# Patient Record
Sex: Female | Born: 1985 | Race: White | Hispanic: No | Marital: Single | Smoking: Current every day smoker
Health system: Southern US, Community
[De-identification: ages and names within clinical notes are randomized; demographics above are authoritative.]

## PROBLEM LIST (undated history)

## (undated) ENCOUNTER — Ambulatory Visit (HOSPITAL_COMMUNITY): Admission: EM | Discharge status: Absent without leave | Payer: Self-pay

---

## 2009-09-11 ENCOUNTER — Emergency Department (HOSPITAL_COMMUNITY): Admission: EM | Admit: 2009-09-11 | Discharge: 2009-09-11 | Payer: Self-pay | Admitting: Emergency Medicine

## 2009-09-12 ENCOUNTER — Inpatient Hospital Stay (HOSPITAL_COMMUNITY): Admission: EM | Admit: 2009-09-12 | Discharge: 2009-09-17 | Payer: Self-pay | Admitting: Psychiatry

## 2009-09-13 ENCOUNTER — Ambulatory Visit: Payer: Self-pay | Admitting: Psychiatry

## 2011-02-08 LAB — RAPID URINE DRUG SCREEN, HOSP PERFORMED
Amphetamines: NOT DETECTED
Benzodiazepines: POSITIVE — AB
Cocaine: NOT DETECTED
Opiates: NOT DETECTED
Tetrahydrocannabinol: NOT DETECTED

## 2011-02-08 LAB — GLUCOSE, CAPILLARY: Glucose-Capillary: 92 mg/dL (ref 70–99)

## 2011-02-08 LAB — COMPREHENSIVE METABOLIC PANEL
ALT: 12 U/L (ref 0–35)
Calcium: 9.6 mg/dL (ref 8.4–10.5)
GFR calc Af Amer: >60 mL/min (ref >60)
Glucose, Bld: 84 mg/dL (ref 70–99)
Sodium: 138 mEq/L (ref 135–145)
Total Protein: 7.2 g/dL (ref 6.0–8.3)

## 2011-02-08 LAB — POCT I-STAT, CHEM 8
BUN: 8 mg/dL (ref 6–23)
Chloride: 101 mEq/L (ref 96–112)
Creatinine, Ser: 0.8 mg/dL (ref 0.4–1.2)
Potassium: 3.4 mEq/L — ABNORMAL LOW (ref 3.5–5.1)
Sodium: 139 mEq/L (ref 135–145)

## 2011-02-08 LAB — CBC
Hemoglobin: 13.7 g/dL (ref 12.0–15.0)
MCHC: 34.5 g/dL (ref 30.0–36.0)
RDW: 13.7 % (ref 11.5–15.5)

## 2011-02-08 LAB — ACETAMINOPHEN LEVEL: Acetaminophen (Tylenol), Serum: <10 ug/mL — ABNORMAL LOW (ref 10–30)

## 2011-02-08 LAB — DIFFERENTIAL
Eosinophils Absolute: 0.2 10*3/uL (ref 0.0–0.7)
Lymphocytes Relative: 36 % (ref 12–46)
Lymphs Abs: 3.2 10*3/uL (ref 0.7–4.0)
Monocytes Relative: 7 % (ref 3–12)
Neutrophils Relative %: 55 % (ref 43–77)

## 2017-05-03 NOTE — Congregational Nurse Program (Signed)
Congregational Nurse Program Note  Date of Encounter: 05/02/2017  Past Medical History: No past medical history on file.  Encounter Details:     CNP Questionnaire - 05/02/17 1548      Patient Demographics   Is this a new or existing patient? New   Patient is considered a/an Not Applicable   Race Caucasian/White     Patient Assistance   Location of Patient Assistance [place]   Patient's financial/insurance status Low Income;Self-Pay (Uninsured)   Uninsured Patient ([employer]s) Yes   Interventions Assisted patient in making appt.   Patient referred to apply for the following financial assistance [employer]   Food insecurities addressed Not Applicable   Transportation assistance No   Assistance securing medications No   Educational health offerings Behavioral health;Navigating the healthcare system     Encounter Details   Primary purpose of visit Chronic Illness/Condition Visit;Safety;Navigating the Healthcare System   Was an Emergency Department visit averted? Not Applicable   Does patient have a medical provider? No   Patient referred to Clinic   Was a mental health screening completed? (GAINS tool) No   Does patient have dental issues? No   Does patient have vision issues? No   Does your patient have an abnormal blood pressure today? No   Since previous encounter, have you referred patient for abnormal blood pressure that resulted in a new diagnosis or medication change? No   Does your patient have an abnormal blood glucose today? No   Since previous encounter, have you referred patient for abnormal blood glucose that resulted in a new diagnosis or medication change? No   Was there a life-saving intervention made? No     Requested assistance with obtaining a HCP and substance abuse treatment.  Referred to [facility].

## 2017-06-23 ENCOUNTER — Encounter: Payer: Self-pay | Admitting: Emergency Medicine

## 2017-06-23 ENCOUNTER — Emergency Department (HOSPITAL_COMMUNITY)
Admission: EM | Admit: 2017-06-23 | Discharge: 2017-06-23 | Disposition: A | Discharge status: Home / Self Care | Payer: Self-pay | Attending: Emergency Medicine | Admitting: Emergency Medicine

## 2017-06-23 DIAGNOSIS — R6883 Chills (without fever): Secondary | ICD-10-CM | POA: Insufficient documentation

## 2017-06-23 DIAGNOSIS — Z79899 Other long term (current) drug therapy: Secondary | ICD-10-CM | POA: Insufficient documentation

## 2017-06-23 DIAGNOSIS — F1193 Opioid use, unspecified with withdrawal: Secondary | ICD-10-CM

## 2017-06-23 DIAGNOSIS — R197 Diarrhea, unspecified: Secondary | ICD-10-CM | POA: Insufficient documentation

## 2017-06-23 DIAGNOSIS — F1123 Opioid dependence with withdrawal: Secondary | ICD-10-CM | POA: Insufficient documentation

## 2017-06-23 LAB — RAPID URINE DRUG SCREEN, HOSP PERFORMED
Amphetamines: POSITIVE — AB
Barbiturates: NOT DETECTED
Benzodiazepines: NOT DETECTED
Cocaine: NOT DETECTED
Opiates: NOT DETECTED
Tetrahydrocannabinol: NOT DETECTED

## 2017-06-23 LAB — CBC WITH DIFFERENTIAL/PLATELET
Basophils Absolute: 0 K/uL (ref 0.0–0.1)
Basophils Relative: 0 %
Eosinophils Absolute: 0.1 K/uL (ref 0.0–0.7)
Eosinophils Relative: 1 %
HCT: 38.6 % (ref 36.0–46.0)
Hemoglobin: 12.6 g/dL (ref 12.0–15.0)
Lymphocytes Relative: 15 %
Lymphs Abs: 1.3 K/uL (ref 0.7–4.0)
MCH: 28.7 pg (ref 26.0–34.0)
MCHC: 32.6 g/dL (ref 30.0–36.0)
MCV: 87.9 fL (ref 78.0–100.0)
Monocytes Absolute: 0.5 K/uL (ref 0.1–1.0)
Monocytes Relative: 6 %
Neutro Abs: 6.3 K/uL (ref 1.7–7.7)
Neutrophils Relative %: 78 %
Platelets: 303 K/uL (ref 150–400)
RBC: 4.39 MIL/uL (ref 3.87–5.11)
RDW: 12.2 % (ref 11.5–15.5)
WBC: 8.1 K/uL (ref 4.0–10.5)

## 2017-06-23 LAB — COMPREHENSIVE METABOLIC PANEL
ALT: 17 U/L (ref 14–54)
ANION GAP: 7 (ref 5–15)
AST: 23 U/L (ref 15–41)
Albumin: 3.4 g/dL — ABNORMAL LOW (ref 3.5–5.0)
Alkaline Phosphatase: 44 U/L (ref 38–126)
BUN: 11 mg/dL (ref 6–20)
CHLORIDE: 102 mmol/L (ref 101–111)
CO2: 26 mmol/L (ref 22–32)
Calcium: 9 mg/dL (ref 8.9–10.3)
Creatinine, Ser: 0.76 mg/dL (ref 0.44–1.00)
Glucose, Bld: 145 mg/dL — ABNORMAL HIGH (ref 65–99)
POTASSIUM: 4.5 mmol/L (ref 3.5–5.1)
Sodium: 135 mmol/L (ref 135–145)
TOTAL PROTEIN: 6.2 g/dL — AB (ref 6.5–8.1)
Total Bilirubin: 0.6 mg/dL (ref 0.3–1.2)

## 2017-06-23 LAB — ETHANOL: Alcohol, Ethyl (B): <5 mg/dL (ref <5)

## 2017-06-23 LAB — PREGNANCY, URINE: Preg Test, Ur: NEGATIVE

## 2017-06-23 MED ORDER — DICYCLOMINE HCL 20 MG PO TABS: 20.0000 mg | ORAL_TABLET | Freq: Four times a day (QID) | ORAL | 0 refills | Status: DC | PRN

## 2017-06-23 MED ORDER — NAPROXEN 500 MG PO TABS: 500.0000 mg | ORAL_TABLET | Freq: Two times a day (BID) | ORAL | 0 refills | Status: DC | PRN

## 2017-06-23 MED ORDER — ONDANSETRON 4 MG PO TBDP: 4.0000 mg | ORAL_TABLET | Freq: Three times a day (TID) | ORAL | 0 refills | Status: DC | PRN

## 2017-06-23 MED ORDER — HYDROXYZINE HCL 25 MG PO TABS: 25.0000 mg | ORAL_TABLET | Freq: Four times a day (QID) | ORAL | 0 refills | Status: DC | PRN

## 2017-06-23 MED ORDER — LOPERAMIDE HCL 2 MG PO CAPS: 2.0000 mg | ORAL_CAPSULE | Freq: Four times a day (QID) | ORAL | 0 refills | Status: DC | PRN

## 2017-06-23 MED ORDER — CLONIDINE HCL 0.1 MG PO TABS: ORAL_TABLET | ORAL | 0 refills | Status: DC

## 2017-06-23 MED ORDER — METHOCARBAMOL 500 MG PO TABS: 500.0000 mg | ORAL_TABLET | Freq: Three times a day (TID) | ORAL | 0 refills | Status: DC | PRN

## 2017-06-23 NOTE — ED Notes (Signed)
Pt aware that urine sample is needed.  

## 2017-06-23 NOTE — ED Provider Notes (Signed)
[facility]-EMERGENCY DEPT Provider Note   CSN: [id number] Arrival date & time: 06/23/17  0086     History   Chief Complaint Chief Complaint  Patient presents with  . Drug Problem    HPI Betty Carter is a 31 y.o. female.  HPI Patient presents with substance abuse. Has been using heavily for the last month, but a gram a day. Had been clean for around 5 years before this. Comes in for treatment. States that she hopes to follow-up with a DS but has not contacted them yet. She injects on her arms. Last use heavily last night and had a smaller doses morning disorder help with withdrawal. Has had some diarrhea. Some nausea. No chest pain or trouble breathing. No fevers. Patient says she hope she is not pregnant. Denies other substance abuse. Denies suicidal or homicidal thoughts. States she uses clean needles and has not had HIV or hepatitis in the past. No past medical history on file.  There are no active problems to display for this patient.   No past surgical history on file.  OB History    No data available       Home Medications    Prior to Admission medications   Medication Sig Start Date End Date Taking? Authorizing Provider  Multiple Vitamin (MULTIVITAMIN WITH MINERALS) TABS tablet Take 1 tablet by mouth daily.   Yes [provider]  naproxen sodium (ANAPROX) 220 MG tablet Take 220 mg by mouth as needed (Headache/Pain).   Yes [provider]  nicotine (NICODERM CQ - DOSED IN MG/24 HOURS) 14 mg/24hr patch Place 14 mg onto the skin daily.   Yes [provider]  Norgestim-Eth Estrad Triphasic (ORTHO TRI-CYCLEN, 28, PO) Take 1 tablet by mouth daily.   Yes [provider]  cloNIDine (CATAPRES) 0.1 MG tablet 1 tab 4 times a day for 6 doses, then twice a day for 4 doses, then in the morning for 2 doses 06/23/17   Benjiman Core, MD  dicyclomine (BENTYL) 20 MG tablet Take 1 tablet (20 mg total) by mouth 4 (four) times daily as needed for spasms.  06/23/17   Benjiman Core, MD  hydrOXYzine (ATARAX/VISTARIL) 25 MG tablet Take 1 tablet (25 mg total) by mouth every 6 (six) hours as needed for anxiety. 06/23/17   Benjiman Core, MD  loperamide (IMODIUM) 2 MG capsule Take 1 capsule (2 mg total) by mouth 4 (four) times daily as needed for diarrhea or loose stools. 06/23/17   Benjiman Core, MD  methocarbamol (ROBAXIN) 500 MG tablet Take 1 tablet (500 mg total) by mouth every 8 (eight) hours as needed for muscle spasms. 06/23/17   Benjiman Core, MD  naproxen (NAPROSYN) 500 MG tablet Take 1 tablet (500 mg total) by mouth 2 (two) times daily as needed. 06/23/17   Benjiman Core, MD  ondansetron (ZOFRAN-ODT) 4 MG disintegrating tablet Take 1 tablet (4 mg total) by mouth every 8 (eight) hours as needed for nausea or vomiting. 06/23/17   Benjiman Core, MD    Family History No family history on file.  Social History Social History  Substance Use Topics  . Smoking status: Not on file  . Smokeless tobacco: Not on file  . Alcohol use Not on file     Allergies   Patient has no known allergies.   Review of Systems Review of Systems  Constitutional: Positive for appetite change and chills. Negative for fever.  HENT: Negative for congestion.   Respiratory: Negative for shortness of breath.  Cardiovascular: Negative for chest pain.  Gastrointestinal: Positive for diarrhea and nausea. Negative for abdominal pain and constipation.  Genitourinary: Negative for flank pain.  Musculoskeletal: Negative for back pain.  Skin: Positive for wound.  Neurological: Negative for tremors and weakness.  Psychiatric/Behavioral: Negative for confusion.     Physical Exam Updated Vital Signs LMP 06/16/2017   Physical Exam  Constitutional: She appears well-developed.  HENT:  Head: Atraumatic.  Neck: Neck supple.  Cardiovascular: Normal rate.   Pulmonary/Chest: Effort normal. She exhibits no tenderness.  Abdominal: Soft.    Musculoskeletal: She exhibits no edema.  Neurological: She is alert.  Skin: Skin is warm. Capillary refill takes less than 2 seconds.  Injection sites bilateral antecubital areas without clear abscess or cellulitis.  Psychiatric: She has a normal mood and affect.     ED Treatments / Results  Labs (all labs ordered are listed, but only abnormal results are displayed) Labs Reviewed  RAPID URINE DRUG SCREEN, HOSP PERFORMED - Abnormal; Notable for the following:       Result Value   Amphetamines POSITIVE (*)    All other components within normal limits  COMPREHENSIVE METABOLIC PANEL - Abnormal; Notable for the following:    Glucose, Bld 145 (*)    Total Protein 6.2 (*)    Albumin 3.4 (*)    All other components within normal limits  CBC WITH DIFFERENTIAL/PLATELET  ETHANOL  PREGNANCY, URINE  HIV ANTIBODY (ROUTINE TESTING)    EKG  EKG Interpretation None       Radiology No results found.  Procedures Procedures (including critical care time)  Medications Ordered in ED Medications - No data to display   Initial Impression / Assessment and Plan / ED Course  I have reviewed the triage vital signs and the nursing notes.  Pertinent labs & imaging results that were available during my care of the patient were reviewed by me and considered in my medical decision making (see chart for details).   patient requesting heroin withdrawal treatment. Labs reassuring. No clear abscesses. Does not appear to need acute inpatient treatment. Sent home with clonidine protocol. States she has follow-up. Will discharge.  Final Clinical Impressions(s) / ED Diagnoses   Final diagnoses:  Opiate withdrawal (HCC)    New Prescriptions Discharge Medication List as of 06/23/2017 11:42 AM    START taking these medications   Details  cloNIDine (CATAPRES) 0.1 MG tablet 1 tab 4 times a day for 6 doses, then twice a day for 4 doses, then in the morning for 2 doses, Print    dicyclomine (BENTYL)  20 MG tablet Take 1 tablet (20 mg total) by mouth 4 (four) times daily as needed for spasms., Starting Sat 06/23/2017, Print    hydrOXYzine (ATARAX/VISTARIL) 25 MG tablet Take 1 tablet (25 mg total) by mouth every 6 (six) hours as needed for anxiety., Starting Sat 06/23/2017, Print    loperamide (IMODIUM) 2 MG capsule Take 1 capsule (2 mg total) by mouth 4 (four) times daily as needed for diarrhea or loose stools., Starting Sat 06/23/2017, Print    methocarbamol (ROBAXIN) 500 MG tablet Take 1 tablet (500 mg total) by mouth every 8 (eight) hours as needed for muscle spasms., Starting Sat 06/23/2017, Print    naproxen (NAPROSYN) 500 MG tablet Take 1 tablet (500 mg total) by mouth 2 (two) times daily as needed., Starting Sat 06/23/2017, Print    ondansetron (ZOFRAN-ODT) 4 MG disintegrating tablet Take 1 tablet (4 mg total) by mouth every 8 (eight) hours  as needed for nausea or vomiting., Starting Sat 06/23/2017, Print         Benjiman Core, MD 06/23/17 1155

## 2017-06-23 NOTE — ED Notes (Signed)
Patient here for heroine detox. Denies SI/HI.

## 2017-06-23 NOTE — ED Triage Notes (Signed)
Pt here for relapse of heroine has been using for the last month after being clean for 5 years. Pt denies thoughts of SI/HI.

## 2017-06-24 LAB — HIV ANTIBODY (ROUTINE TESTING W REFLEX): HIV Screen 4th Generation wRfx: NONREACTIVE

## 2018-03-12 ENCOUNTER — Emergency Department (HOSPITAL_COMMUNITY)
Admission: EM | Admit: 2018-03-12 | Discharge: 2018-03-12 | Disposition: A | Discharge status: Home / Self Care | Payer: Self-pay | Attending: Emergency Medicine | Admitting: Emergency Medicine

## 2018-03-12 ENCOUNTER — Encounter (HOSPITAL_COMMUNITY): Payer: Self-pay

## 2018-03-12 ENCOUNTER — Other Ambulatory Visit: Payer: Self-pay

## 2018-03-12 DIAGNOSIS — D690 Allergic purpura: Secondary | ICD-10-CM | POA: Insufficient documentation

## 2018-03-12 DIAGNOSIS — Z79899 Other long term (current) drug therapy: Secondary | ICD-10-CM | POA: Insufficient documentation

## 2018-03-12 DIAGNOSIS — F17200 Nicotine dependence, unspecified, uncomplicated: Secondary | ICD-10-CM | POA: Insufficient documentation

## 2018-03-12 LAB — CBC WITH DIFFERENTIAL/PLATELET
BASOS ABS: 0 10*3/uL (ref 0.0–0.1)
Basophils Relative: 0 %
EOS PCT: 1 %
Eosinophils Absolute: 0 10*3/uL (ref 0.0–0.7)
HCT: 33 % — ABNORMAL LOW (ref 36.0–46.0)
Hemoglobin: 10.9 g/dL — ABNORMAL LOW (ref 12.0–15.0)
Lymphocytes Relative: 17 %
Lymphs Abs: 1.4 10*3/uL (ref 0.7–4.0)
MCH: 27.2 pg (ref 26.0–34.0)
MCHC: 33 g/dL (ref 30.0–36.0)
MCV: 82.3 fL (ref 78.0–100.0)
MONO ABS: 0.8 10*3/uL (ref 0.1–1.0)
Monocytes Relative: 10 %
Neutro Abs: 5.8 10*3/uL (ref 1.7–7.7)
Neutrophils Relative %: 72 %
PLATELETS: 225 10*3/uL (ref 150–400)
RBC: 4.01 MIL/uL (ref 3.87–5.11)
RDW: 13.4 % (ref 11.5–15.5)
WBC: 8 10*3/uL (ref 4.0–10.5)

## 2018-03-12 LAB — COMPREHENSIVE METABOLIC PANEL
ALT: 18 U/L (ref 14–54)
AST: 19 U/L (ref 15–41)
Albumin: 3.4 g/dL — ABNORMAL LOW (ref 3.5–5.0)
Alkaline Phosphatase: 76 U/L (ref 38–126)
Anion gap: 8 (ref 5–15)
BUN: 12 mg/dL (ref 6–20)
CHLORIDE: 101 mmol/L (ref 101–111)
CO2: 26 mmol/L (ref 22–32)
Calcium: 8.7 mg/dL — ABNORMAL LOW (ref 8.9–10.3)
Creatinine, Ser: 0.73 mg/dL (ref 0.44–1.00)
GFR calc Af Amer: >60 mL/min (ref >60)
Glucose, Bld: 90 mg/dL (ref 65–99)
POTASSIUM: 4.2 mmol/L (ref 3.5–5.1)
Sodium: 135 mmol/L (ref 135–145)
Total Bilirubin: 0.6 mg/dL (ref 0.3–1.2)
Total Protein: 7 g/dL (ref 6.5–8.1)

## 2018-03-12 LAB — PROTIME-INR
INR: 1.07
PROTHROMBIN TIME: 13.8 s (ref 11.4–15.2)

## 2018-03-12 LAB — I-STAT CG4 LACTIC ACID, ED: LACTIC ACID, VENOUS: 0.34 mmol/L — AB (ref 0.5–1.9)

## 2018-03-12 LAB — URINALYSIS, ROUTINE W REFLEX MICROSCOPIC
Bilirubin Urine: NEGATIVE
Glucose, UA: NEGATIVE mg/dL
KETONES UR: NEGATIVE mg/dL
Leukocytes, UA: NEGATIVE
Nitrite: NEGATIVE
PH: 5 (ref 5.0–8.0)
PROTEIN: NEGATIVE mg/dL
Specific Gravity, Urine: 1.023 (ref 1.005–1.030)

## 2018-03-12 LAB — RAPID URINE DRUG SCREEN, HOSP PERFORMED
AMPHETAMINES: POSITIVE — AB
BENZODIAZEPINES: POSITIVE — AB
Barbiturates: NOT DETECTED
COCAINE: NOT DETECTED
Opiates: NOT DETECTED
Tetrahydrocannabinol: NOT DETECTED

## 2018-03-12 MED ORDER — PREDNISONE 20 MG PO TABS: 40.0000 mg | ORAL_TABLET | Freq: Every day | ORAL | 0 refills | Status: DC

## 2018-03-12 NOTE — ED Provider Notes (Signed)
Patient placed in Quick Look pathway, seen and evaluated   Chief Complaint: Rash  HPI: Patient with history of heroin abuse -- presents with 1 day of rapidly progressing rash on bilateral lower extremities.  Areas started as a swollen red area on her right lateral ankle.  Overnight patient developed many more areas including her bilateral buttocks and several areas on her legs have turned black centrally.  She complains of significant pain and inability to sit.  She has felt flushed but denies fevers.  No nausea or vomiting.  No treatments prior to arrival.  She denies any current drug abuse or alcohol use.  She is on Paxil and Buprenorphine  and a birth control pill.  No new medications.  No intraoral lesions.  ROS:  Positive ROS: (+) Rash, leg pain Negative ROS: (-) fever, N/V  Physical Exam:   Gen: No distress  Neuro: Awake and Alert  Skin: Warm    Focused Exam: Heart tachycardia, nml S1,S2, no m/r/g; Lungs CTAB; Abd soft, NT, no rebound or guarding; Ext 2+ pedal pulses bilaterally, patient with erythema of the right lateral ankle with a small papule centrally; she has multiple isolated plaques with black tissue centrally, including the majority of her bilateral buttocks.  No lesions above the waist.  BP 107/80 (BP Location: Right Arm)   Pulse (!) 121   Temp 98.9 F (37.2 C) (Oral)   Resp 16   LMP 02/16/2018 (Within Days)   SpO2 99%   Plan: Blood work and blood cultures to evaluate for infectious etiology.  Unclear if lesions represent vasculitic etiology, cellulitis or are embolic.  Initiation of care has begun. The patient has been counseled on the process, plan, and necessity for staying for the completion/evaluation, and the remainder of the medical screening examination    Renne Crigler, PA-C 03/12/18 1429    Charlynne Pander, MD 03/12/18 [phone]

## 2018-03-12 NOTE — Discharge Instructions (Signed)
Prednisone daily for 5 days ER for worsening symptoms. See your doctor in 2-3 days for recheck if possible. Motrin for pain Pepcid to prevent stomach ulcers

## 2018-03-12 NOTE — ED Triage Notes (Signed)
Pt here from UC with insect bite last Thursday. She has had significant rash spreading all over her legs into her buttocks.The areas noted to be red and pt states they are painful.

## 2018-03-12 NOTE — ED Provider Notes (Signed)
[facility] EMERGENCY DEPARTMENT Provider Note   CSN: [id number] Arrival date & time: 03/12/18  1317     History   Chief Complaint Chief Complaint  Patient presents with  . Insect Bite    HPI Betty Carter is a 32 y.o. female.  HPI  The patient is a 32 year old female, she denies any chronic medical conditions, she states that she takes no daily medications, she does not use any drugs and has not started any over-the-counter medications recently.  She reports that she has had a rash which started in the last 2 days, initially at the ankles but has rapidly progressed overnight to involve large areas of her lower extremities and buttocks all the way up to the waist.  She denies any rash above the waist, denies any itching but states that these areas are actually very tender.  She was seen at urgent care, earlier today and referred to the emergency department.  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  History reviewed. No pertinent past medical history.  There are no active problems to display for this patient.   History reviewed. No pertinent surgical history.   OB History   None      Home Medications    Prior to Admission medications   Medication Sig Start Date End Date Taking? Authorizing Provider  cloNIDine (CATAPRES) 0.1 MG tablet 1 tab 4 times a day for 6 doses, then twice a day for 4 doses, then in the morning for 2 doses Patient taking differently: Take 0.1 mg by mouth See admin instructions. 1 tab 4 times a day for 6 doses, then twice a day for 4 doses, then in the morning for 2 doses 06/23/17  Yes Benjiman Core, MD  dicyclomine (BENTYL) 20 MG tablet Take 1 tablet (20 mg total) by mouth 4 (four) times daily as needed for spasms. 06/23/17  Yes Benjiman Core, MD  hydrOXYzine (ATARAX/VISTARIL) 25 MG tablet Take 1 tablet (25 mg total) by mouth every 6 (six) hours as needed for anxiety. 06/23/17  Yes Benjiman Core, MD    loperamide (IMODIUM) 2 MG capsule Take 1 capsule (2 mg total) by mouth 4 (four) times daily as needed for diarrhea or loose stools. 06/23/17  Yes Benjiman Core, MD  methocarbamol (ROBAXIN) 500 MG tablet Take 1 tablet (500 mg total) by mouth every 8 (eight) hours as needed for muscle spasms. 06/23/17  Yes Benjiman Core, MD  Multiple Vitamin (MULTIVITAMIN WITH MINERALS) TABS tablet Take 1 tablet by mouth daily.   Yes [provider]  naproxen (NAPROSYN) 500 MG tablet Take 1 tablet (500 mg total) by mouth 2 (two) times daily as needed. 06/23/17  Yes Benjiman Core, MD  nicotine (NICODERM CQ - DOSED IN MG/24 HOURS) 14 mg/24hr patch Place 14 mg onto the skin daily.   Yes [provider]  Norgestim-Eth Estrad Triphasic (ORTHO TRI-CYCLEN, 28, PO) Take 1 tablet by mouth daily.   Yes [provider]  ondansetron (ZOFRAN-ODT) 4 MG disintegrating tablet Take 1 tablet (4 mg total) by mouth every 8 (eight) hours as needed for nausea or vomiting. 06/23/17  Yes Benjiman Core, MD  predniSONE (DELTASONE) 20 MG tablet Take 2 tablets (40 mg total) by mouth daily. 03/12/18   Eber Hong, MD    Family History History reviewed. No pertinent family history.  Social History Social History   Tobacco Use  . Smoking status: Current Every Day Smoker    Packs/day: 0.50  . Smokeless tobacco: Never  Used  Substance Use Topics  . Alcohol use: Never    Frequency: Never  . Drug use: Not on file     Allergies   Patient has no known allergies.   Review of Systems Review of Systems  All other systems reviewed and are negative.    Physical Exam Updated Vital Signs BP (!) 112/59 (BP Location: Right Arm)   Pulse (!) 111   Temp 98.9 F (37.2 C) (Oral)   Resp 18   LMP 02/16/2018 (Within Days)   SpO2 98%   Physical Exam  Constitutional: She appears well-developed and well-nourished. No distress.  HENT:  Head: Normocephalic and atraumatic.  Mouth/Throat: Oropharynx is  clear and moist. No oropharyngeal exudate.  Eyes: Pupils are equal, round, and reactive to light. Conjunctivae and EOM are normal. Right eye exhibits no discharge. Left eye exhibits no discharge. No scleral icterus.  Neck: Normal range of motion. Neck supple. No JVD present. No thyromegaly present.  Cardiovascular: Normal rate, regular rhythm, normal heart sounds and intact distal pulses. Exam reveals no gallop and no friction rub.  No murmur heard. Pulmonary/Chest: Effort normal and breath sounds normal. No respiratory distress. She has no wheezes. She has no rales.  Abdominal: Soft. Bowel sounds are normal. She exhibits no distension and no mass. There is no tenderness.  Musculoskeletal: Normal range of motion. She exhibits no edema or tenderness.  Lymphadenopathy:    She has no cervical adenopathy.  Neurological: She is alert. Coordination normal.  Skin: Skin is warm and dry. Rash noted. No erythema.  Large purpuric lesions across the lower extremities and the buttock, these are tender, there is no vesicles or pustules, scattered petechiae  Psychiatric: She has a normal mood and affect. Her behavior is normal.  Nursing note and vitals reviewed.    ED Treatments / Results  Labs (all labs ordered are listed, but only abnormal results are displayed) Labs Reviewed  CBC WITH DIFFERENTIAL/PLATELET - Abnormal; Notable for the following components:      Result Value   Hemoglobin 10.9 (*)    HCT 33.0 (*)    All other components within normal limits  COMPREHENSIVE METABOLIC PANEL - Abnormal; Notable for the following components:   Calcium 8.7 (*)    Albumin 3.4 (*)    All other components within normal limits  RAPID URINE DRUG SCREEN, HOSP PERFORMED - Abnormal; Notable for the following components:   Benzodiazepines POSITIVE (*)    Amphetamines POSITIVE (*)    All other components within normal limits  URINALYSIS, ROUTINE W REFLEX MICROSCOPIC - Abnormal; Notable for the following  components:   Color, Urine AMBER (*)    APPearance HAZY (*)    Hgb urine dipstick SMALL (*)    Bacteria, UA RARE (*)    All other components within normal limits  I-STAT CG4 LACTIC ACID, ED - Abnormal; Notable for the following components:   Lactic Acid, Venous 0.34 (*)    All other components within normal limits  CULTURE, BLOOD (ROUTINE X 2)  CULTURE, BLOOD (ROUTINE X 2)  PROTIME-INR  POC URINE PREG, ED    EKG None  Radiology No results found.  Procedures Procedures (including critical care time)  Medications Ordered in ED Medications - No data to display   Initial Impression / Assessment and Plan / ED Course  I have reviewed the triage vital signs and the nursing notes.  Pertinent labs & imaging results that were available during my care of the patient were reviewed by me  and considered in my medical decision making (see chart for details).    The patient has a new rash which appears consistent with some type of purpuric process whether this is TTP, ITP, HSP or other purpuric lesion.  Labs, likely abnormal, patient otherwise appears hemodynamically stable, no fever, tachycarida improving  Vitals:   03/12/18 1700 03/12/18 1730 03/12/18 1800 03/12/18 1927  BP: 111/74 101/62 106/61 (!) 112/59  Pulse: (!) 105 (!) 114 (!) 107 (!) 111  Resp:    18  Temp:      TempSrc:      SpO2: 96% 100% 95% 98%   Charge pulse is approximately 110 on my exam, the patient has unremarkable labs which is consistent with HSP, she does not have any signs of renal failure.  The patient was informed of her results and the indications for follow-up, she will get a course of prednisone and referral to the local [facility] wellness center for follow-up with a medical physician.  Final Clinical Impressions(s) / ED Diagnoses   Final diagnoses:  HSP (Henoch Schonlein purpura) [facility])    ED Discharge Orders        Ordered    predniSONE (DELTASONE) 20 MG tablet  Daily     03/12/18 1929         Eber Hong, MD 03/12/18 1932

## 2018-03-13 DIAGNOSIS — Z720 Tobacco use: Secondary | ICD-10-CM | POA: Insufficient documentation

## 2018-03-13 DIAGNOSIS — I776 Arteritis, unspecified: Secondary | ICD-10-CM | POA: Insufficient documentation

## 2018-03-13 DIAGNOSIS — F419 Anxiety disorder, unspecified: Secondary | ICD-10-CM

## 2018-03-13 DIAGNOSIS — F329 Major depressive disorder, single episode, unspecified: Secondary | ICD-10-CM | POA: Diagnosis present

## 2018-03-13 DIAGNOSIS — F1911 Other psychoactive substance abuse, in remission: Secondary | ICD-10-CM | POA: Diagnosis present

## 2018-03-17 LAB — CULTURE, BLOOD (ROUTINE X 2)
CULTURE: NO GROWTH
CULTURE: NO GROWTH
SPECIAL REQUESTS: ADEQUATE
Special Requests: ADEQUATE

## 2018-08-13 ENCOUNTER — Other Ambulatory Visit: Payer: Self-pay

## 2018-08-13 ENCOUNTER — Ambulatory Visit (HOSPITAL_COMMUNITY)
Admission: EM | Admit: 2018-08-13 | Discharge: 2018-08-13 | Disposition: A | Discharge status: Home / Self Care | Payer: Self-pay | Attending: Family Medicine | Admitting: Family Medicine

## 2018-08-13 ENCOUNTER — Encounter (HOSPITAL_COMMUNITY): Payer: Self-pay

## 2018-08-13 DIAGNOSIS — R600 Localized edema: Secondary | ICD-10-CM

## 2018-08-13 DIAGNOSIS — R7989 Other specified abnormal findings of blood chemistry: Secondary | ICD-10-CM

## 2018-08-13 DIAGNOSIS — R2243 Localized swelling, mass and lump, lower limb, bilateral: Secondary | ICD-10-CM

## 2018-08-13 DIAGNOSIS — R809 Proteinuria, unspecified: Secondary | ICD-10-CM

## 2018-08-13 DIAGNOSIS — Z3202 Encounter for pregnancy test, result negative: Secondary | ICD-10-CM

## 2018-08-13 LAB — POCT URINALYSIS DIP (DEVICE)
BILIRUBIN URINE: NEGATIVE
Glucose, UA: NEGATIVE mg/dL
Ketones, ur: NEGATIVE mg/dL
Nitrite: NEGATIVE
PH: 5.5 (ref 5.0–8.0)
Specific Gravity, Urine: >=1.03 (ref 1.005–1.030)
Urobilinogen, UA: 0.2 mg/dL (ref 0.0–1.0)

## 2018-08-13 LAB — POCT I-STAT, CHEM 8
BUN: 23 mg/dL — ABNORMAL HIGH (ref 6–20)
CALCIUM ION: 1.18 mmol/L (ref 1.15–1.40)
CHLORIDE: 106 mmol/L (ref 98–111)
Creatinine, Ser: 1.3 mg/dL — ABNORMAL HIGH (ref 0.44–1.00)
GLUCOSE: 108 mg/dL — AB (ref 70–99)
HCT: 28 % — ABNORMAL LOW (ref 36.0–46.0)
Hemoglobin: 9.5 g/dL — ABNORMAL LOW (ref 12.0–15.0)
Potassium: 4.7 mmol/L (ref 3.5–5.1)
SODIUM: 137 mmol/L (ref 135–145)
TCO2: 24 mmol/L (ref 22–32)

## 2018-08-13 MED ORDER — FUROSEMIDE 40 MG PO TABS: 40.0000 mg | ORAL_TABLET | Freq: Every day | ORAL | 0 refills | Status: DC

## 2018-08-13 NOTE — Discharge Instructions (Signed)
Compared to prior lab work, you kidney function has decreased with protein in your urine, this means you are having some damage to the kidney. You can take lasix for the next few days to help with some leg swelling, but you will have to see a kidney doctor for further workup to determine what is causing the kidney damage. If experiencing chest pain, shortness of breath, weakness, dizziness, please go to the emergency department for further evaluation needed.

## 2018-08-13 NOTE — ED Provider Notes (Signed)
[facility]    CSN: [id number] Arrival date & time: 08/13/18  1512     History   Chief Complaint Chief Complaint  Patient presents with  . swelling    HPI Betty Carter is a 32 y.o. female.   32 year old female comes in for 1.5-2 week history of bilateral leg swelling that has been increasing gradually.  She has a history of HSP vasculitis that was diagnosed 5 months ago, had been following with dermatologist and was able to taper off prednisone.  She states that due to financial reasons, she has not been able to continue follow-up with dermatologist, but was told to email with questions.  She was unable to reach her dermatologist for current symptom onset.  Denies any new rashes/wounds.  Denies abdominal pain.  Denies fever, chills, night sweats.  States has some pinkness to bilateral legs that is new, with slight burning sensation.  Denies numbness, tingling.  Has some pain to the ankle due to swelling.  She has mild dyspnea on exertion with walking.  Denies wheezing, chest pain, palpitations, orthopnea.  Denies long travels.  On oral birth control.  Denies personal or family history of blood clots.  Denies injury/trauma. She has been using compression stockings/elevation with mild relief.  She states only changes in the past 2 weeks is that she has been working more, which requires long hours of standing.  Current everyday smoker.     History reviewed. No pertinent past medical history.  There are no active problems to display for this patient.   History reviewed. No pertinent surgical history.  OB History   None      Home Medications    Prior to Admission medications   Medication Sig Start Date End Date Taking? Authorizing Provider  buprenorphine (SUBUTEX) 8 MG SUBL SL tablet Place 16 mg under the tongue daily.   Yes [provider]  gabapentin (NEURONTIN) 100 MG capsule Take 100 mg by mouth 3 (three) times daily as needed.   Yes [provider]  PARoxetine (PAXIL) 20 MG tablet Take 20 mg by mouth daily.   Yes [provider]  furosemide (LASIX) 40 MG tablet Take 1 tablet (40 mg total) by mouth daily for 3 days. 08/13/18 08/16/18  Belinda Fisher, PA-C  Multiple Vitamin (MULTIVITAMIN WITH MINERALS) TABS tablet Take 1 tablet by mouth daily.    [provider]  Norgestim-Eth Estrad Triphasic (ORTHO TRI-CYCLEN, 28, PO) Take 1 tablet by mouth daily.    [provider]    Family History History reviewed. No pertinent family history.  Social History Social History   Tobacco Use  . Smoking status: Current Every Day Smoker    Packs/day: 0.50  . Smokeless tobacco: Never Used  Substance Use Topics  . Alcohol use: Never    Frequency: Never  . Drug use: Not on file     Allergies   Patient has no known allergies.   Review of Systems Review of Systems  Reason unable to perform ROS: See HPI as above.     Physical Exam Triage Vital Signs ED Triage Vitals  Enc Vitals Group     BP 08/13/18 1635 (!) 157/107     Pulse Rate 08/13/18 1635 (!) 105     Resp 08/13/18 1635 19     Temp 08/13/18 1635 98.2 F (36.8 C)     Temp Source 08/13/18 1635 Oral     SpO2 08/13/18 1635 98 %     Weight  08/13/18 1634 160 lb (72.6 kg)     Height --      Head Circumference --      Peak Flow --      Pain Score --      Pain Loc --      Pain Edu? --      Excl. in GC? --    No data found.  Updated Vital Signs BP (!) 157/107 (BP Location: Left Arm)   Pulse (!) 105   Temp 98.2 F (36.8 C) (Oral)   Resp 19   Wt 160 lb (72.6 kg)   LMP 08/06/2018   SpO2 98%   Physical Exam  Constitutional: She is oriented to person, place, and time. She appears well-developed and well-nourished. No distress.  HENT:  Head: Normocephalic and atraumatic.  Eyes: Pupils are equal, round, and reactive to light. Conjunctivae are normal.  Cardiovascular: Normal rate, regular rhythm and normal heart sounds. Exam reveals no gallop  and no friction rub.  No murmur heard. Pulmonary/Chest: Effort normal and breath sounds normal. No accessory muscle usage or stridor. No respiratory distress. She has no decreased breath sounds. She has no wheezes. She has no rhonchi. She has no rales.  Talking in full sentences without difficulty.   Musculoskeletal:  Bilateral 2+ pitting edema up to the knees with mild erythema to the ankles. No warmth, no pain on palpation. Negative homans. Pedal pulse hard to obtain due to swelling. Bilateral foot/toes warm with cap refill <2s  Neurological: She is alert and oriented to person, place, and time.  Skin: She is not diaphoretic.     UC Treatments / Results  Labs (all labs ordered are listed, but only abnormal results are displayed) Labs Reviewed  POCT URINALYSIS DIP (DEVICE) - Abnormal; Notable for the following components:      Result Value   Hgb urine dipstick LARGE (*)    Protein, ur >=300 (*)    Leukocytes, UA SMALL (*)    All other components within normal limits  POCT I-STAT, CHEM 8 - Abnormal; Notable for the following components:   BUN 23 (*)    Creatinine, Ser 1.30 (*)    Glucose, Bld 108 (*)    Hemoglobin 9.5 (*)    HCT 28.0 (*)    All other components within normal limits    EKG None  Radiology No results found.  Procedures Procedures (including critical care time)  Medications Ordered in UC Medications - No data to display  Initial Impression / Assessment and Plan / UC Course  I have reviewed the triage vital signs and the nursing notes.  Pertinent labs & imaging results that were available during my care of the patient were reviewed by me and considered in my medical decision making (see chart for details).    Recheck HR 94. Lungs clear to auscultation bilaterally without adventitious lung sounds, negative homans, O2 sat 98%, low suspicion for PE/DVT. Patient with increase in creatinine, + proteinuria. Although concerning history and exam, no immediate need  for further evaluation at the emergency department. Stressed importance of follow up with nephrology for further evaluation and management. Will provide short course of lasix for symptomatic treatment. Strict return precautions given. Patient expresses understanding and agrees to plan.  Case discussed with Dr Tracie Harrier, who agrees to plan.  Final Clinical Impressions(s) / UC Diagnoses   Final diagnoses:  Bilateral leg edema  Blood creatinine increased compared with prior measurement    ED Prescriptions    Medication Sig  Dispense Auth. Provider   furosemide (LASIX) 40 MG tablet  (Status: Discontinued) Take 1 tablet (40 mg total) by mouth daily for 3 days. 3 tablet Yu, Amy V, PA-C   furosemide (LASIX) 40 MG tablet Take 1 tablet (40 mg total) by mouth daily for 3 days. 3 tablet Threasa Alpha, [place] 08/13/18 [id number]

## 2018-08-13 NOTE — ED Triage Notes (Signed)
Pr states she has sever swelling in her legs and feet. Pt has her hospital notes from [facility].

## 2018-08-14 ENCOUNTER — Telehealth (HOSPITAL_COMMUNITY): Payer: Self-pay | Admitting: Emergency Medicine

## 2018-08-14 NOTE — Telephone Encounter (Signed)
Pt called wanting a referral or recommendation for a Nephrologist.  Returned pt's call and told her to call the doctor on the first page of her AVS.  She states she did but they did not see a referral for her to be seen.  I informed pt that we do not do referrals and to call back and let them know that she does not have a PCP but that she was seen here and we recommended she follow up with them.   If they are still unwilling to see her, I recommended she call [facility] and establish care with a PCP for further follow up and if any other problems or concerns until then to follow up here.  Pt stated understanding.

## 2018-11-25 ENCOUNTER — Ambulatory Visit: Payer: Self-pay | Admitting: Internal Medicine

## 2018-11-29 ENCOUNTER — Ambulatory Visit: Payer: Self-pay | Admitting: Internal Medicine

## 2019-03-01 ENCOUNTER — Inpatient Hospital Stay (HOSPITAL_COMMUNITY)
Admission: EM | Admit: 2019-03-01 | Discharge: 2019-03-03 | DRG: 446 | Disposition: A | Discharge status: Home / Self Care | Payer: Self-pay | Attending: Internal Medicine | Admitting: Internal Medicine

## 2019-03-01 ENCOUNTER — Encounter (HOSPITAL_COMMUNITY): Payer: Self-pay | Admitting: Emergency Medicine

## 2019-03-01 ENCOUNTER — Emergency Department (HOSPITAL_COMMUNITY): Payer: Self-pay

## 2019-03-01 ENCOUNTER — Other Ambulatory Visit: Payer: Self-pay

## 2019-03-01 DIAGNOSIS — K8070 Calculus of gallbladder and bile duct without cholecystitis without obstruction: Secondary | ICD-10-CM

## 2019-03-01 DIAGNOSIS — F419 Anxiety disorder, unspecified: Secondary | ICD-10-CM | POA: Diagnosis present

## 2019-03-01 DIAGNOSIS — I776 Arteritis, unspecified: Secondary | ICD-10-CM | POA: Diagnosis present

## 2019-03-01 DIAGNOSIS — F17210 Nicotine dependence, cigarettes, uncomplicated: Secondary | ICD-10-CM | POA: Diagnosis present

## 2019-03-01 DIAGNOSIS — K8050 Calculus of bile duct without cholangitis or cholecystitis without obstruction: Secondary | ICD-10-CM | POA: Diagnosis present

## 2019-03-01 DIAGNOSIS — R7989 Other specified abnormal findings of blood chemistry: Secondary | ICD-10-CM | POA: Diagnosis present

## 2019-03-01 DIAGNOSIS — K8066 Calculus of gallbladder and bile duct with acute and chronic cholecystitis without obstruction: Principal | ICD-10-CM | POA: Diagnosis present

## 2019-03-01 DIAGNOSIS — R748 Abnormal levels of other serum enzymes: Secondary | ICD-10-CM | POA: Diagnosis present

## 2019-03-01 DIAGNOSIS — F32A Depression, unspecified: Secondary | ICD-10-CM | POA: Diagnosis present

## 2019-03-01 DIAGNOSIS — Z5329 Procedure and treatment not carried out because of patient's decision for other reasons: Secondary | ICD-10-CM | POA: Diagnosis not present

## 2019-03-01 DIAGNOSIS — R933 Abnormal findings on diagnostic imaging of other parts of digestive tract: Secondary | ICD-10-CM

## 2019-03-01 DIAGNOSIS — R7401 Elevation of levels of liver transaminase levels: Secondary | ICD-10-CM | POA: Diagnosis present

## 2019-03-01 DIAGNOSIS — Z79899 Other long term (current) drug therapy: Secondary | ICD-10-CM

## 2019-03-01 DIAGNOSIS — K812 Acute cholecystitis with chronic cholecystitis: Secondary | ICD-10-CM

## 2019-03-01 DIAGNOSIS — F329 Major depressive disorder, single episode, unspecified: Secondary | ICD-10-CM | POA: Diagnosis present

## 2019-03-01 DIAGNOSIS — F1911 Other psychoactive substance abuse, in remission: Secondary | ICD-10-CM | POA: Diagnosis present

## 2019-03-01 DIAGNOSIS — Z793 Long term (current) use of hormonal contraceptives: Secondary | ICD-10-CM

## 2019-03-01 DIAGNOSIS — R740 Nonspecific elevation of levels of transaminase and lactic acid dehydrogenase [LDH]: Secondary | ICD-10-CM | POA: Diagnosis present

## 2019-03-01 LAB — URINALYSIS, ROUTINE W REFLEX MICROSCOPIC
Glucose, UA: NEGATIVE mg/dL
Ketones, ur: NEGATIVE mg/dL
Leukocytes,Ua: NEGATIVE
Nitrite: NEGATIVE
Protein, ur: 100 mg/dL — AB
RBC / HPF: >50 RBC/hpf — ABNORMAL HIGH (ref 0–5)
Specific Gravity, Urine: 1.024 (ref 1.005–1.030)
pH: 6 (ref 5.0–8.0)

## 2019-03-01 LAB — COMPREHENSIVE METABOLIC PANEL
ALT: 741 U/L — ABNORMAL HIGH (ref 0–44)
AST: 588 U/L — ABNORMAL HIGH (ref 15–41)
Albumin: 4.1 g/dL (ref 3.5–5.0)
Alkaline Phosphatase: 134 U/L — ABNORMAL HIGH (ref 38–126)
Anion gap: 8 (ref 5–15)
BUN: 13 mg/dL (ref 6–20)
CO2: 24 mmol/L (ref 22–32)
Calcium: 9.4 mg/dL (ref 8.9–10.3)
Chloride: 106 mmol/L (ref 98–111)
Creatinine, Ser: 0.7 mg/dL (ref 0.44–1.00)
GFR calc Af Amer: >60 mL/min (ref >60)
GFR calc non Af Amer: >60 mL/min (ref >60)
Glucose, Bld: 111 mg/dL — ABNORMAL HIGH (ref 70–99)
Potassium: 3.7 mmol/L (ref 3.5–5.1)
Sodium: 138 mmol/L (ref 135–145)
Total Bilirubin: 4.6 mg/dL — ABNORMAL HIGH (ref 0.3–1.2)
Total Protein: 7.5 g/dL (ref 6.5–8.1)

## 2019-03-01 LAB — CBC WITH DIFFERENTIAL/PLATELET
Abs Immature Granulocytes: 0.07 10*3/uL (ref 0.00–0.07)
Basophils Absolute: 0.1 10*3/uL (ref 0.0–0.1)
Basophils Relative: 1 %
Eosinophils Absolute: 0.1 10*3/uL (ref 0.0–0.5)
Eosinophils Relative: 1 %
HCT: 42.4 % (ref 36.0–46.0)
Hemoglobin: 13.7 g/dL (ref 12.0–15.0)
Immature Granulocytes: 1 %
Lymphocytes Relative: 19 %
Lymphs Abs: 1.7 10*3/uL (ref 0.7–4.0)
MCH: 28.7 pg (ref 26.0–34.0)
MCHC: 32.3 g/dL (ref 30.0–36.0)
MCV: 88.9 fL (ref 80.0–100.0)
Monocytes Absolute: 1 10*3/uL (ref 0.1–1.0)
Monocytes Relative: 10 %
Neutro Abs: 6.5 10*3/uL (ref 1.7–7.7)
Neutrophils Relative %: 68 %
Platelets: 348 10*3/uL (ref 150–400)
RBC: 4.77 MIL/uL (ref 3.87–5.11)
RDW: 12.3 % (ref 11.5–15.5)
WBC: 9.4 10*3/uL (ref 4.0–10.5)
nRBC: 0 % (ref 0.0–0.2)

## 2019-03-01 LAB — I-STAT BETA HCG BLOOD, ED (MC, WL, AP ONLY): I-stat hCG, quantitative: <5 m[IU]/mL (ref <5)

## 2019-03-01 LAB — LIPASE, BLOOD: Lipase: 59 U/L — ABNORMAL HIGH (ref 11–51)

## 2019-03-01 MED ORDER — ACETAMINOPHEN 650 MG RE SUPP: 650.0000 mg | Freq: Four times a day (QID) | RECTAL | Status: DC | PRN

## 2019-03-01 MED ORDER — ACETAMINOPHEN 325 MG PO TABS: 650.0000 mg | ORAL_TABLET | Freq: Four times a day (QID) | ORAL | Status: DC | PRN

## 2019-03-01 MED ORDER — SORBITOL 70 % SOLN
30.0000 mL | Freq: Every day | Status: DC | PRN
  Filled 2019-03-01: qty 30

## 2019-03-01 MED ORDER — FLEET ENEMA 7-19 GM/118ML RE ENEM: 1.0000 | ENEMA | Freq: Once | RECTAL | Status: DC | PRN

## 2019-03-01 MED ORDER — SODIUM CHLORIDE 0.9% FLUSH: 3.0000 mL | Freq: Once | INTRAVENOUS | Status: DC

## 2019-03-01 MED ORDER — ONDANSETRON HCL 4 MG PO TABS: 4.0000 mg | ORAL_TABLET | Freq: Four times a day (QID) | ORAL | Status: DC | PRN

## 2019-03-01 MED ORDER — ENOXAPARIN SODIUM 40 MG/0.4ML ~~LOC~~ SOLN
40.0000 mg | SUBCUTANEOUS | Status: DC
  Administered 2019-03-02 – 2019-03-03 (×2): 40 mg via SUBCUTANEOUS
  Filled 2019-03-01 (×2): qty 0.4

## 2019-03-01 MED ORDER — SODIUM CHLORIDE 0.9% FLUSH
3.0000 mL | Freq: Two times a day (BID) | INTRAVENOUS | Status: DC
  Administered 2019-03-01: 12:00:00 3 mL via INTRAVENOUS

## 2019-03-01 MED ORDER — PANTOPRAZOLE SODIUM 40 MG IV SOLR
40.0000 mg | Freq: Once | INTRAVENOUS | Status: AC
  Administered 2019-03-01: 05:00:00 40 mg via INTRAVENOUS
  Filled 2019-03-01: qty 40

## 2019-03-01 MED ORDER — SODIUM CHLORIDE 0.9 % IV SOLN
INTRAVENOUS | Status: DC
  Administered 2019-03-01 – 2019-03-02 (×3): via INTRAVENOUS

## 2019-03-01 MED ORDER — ONDANSETRON HCL 4 MG/2ML IJ SOLN
4.0000 mg | Freq: Once | INTRAMUSCULAR | Status: AC
  Administered 2019-03-01: 05:00:00 4 mg via INTRAVENOUS
  Filled 2019-03-01: qty 2

## 2019-03-01 MED ORDER — ONDANSETRON HCL 4 MG/2ML IJ SOLN
4.0000 mg | Freq: Four times a day (QID) | INTRAMUSCULAR | Status: DC | PRN
  Administered 2019-03-01 – 2019-03-02 (×2): 4 mg via INTRAVENOUS
  Filled 2019-03-01 (×2): qty 2

## 2019-03-01 MED ORDER — SENNA 8.6 MG PO TABS
1.0000 | ORAL_TABLET | Freq: Two times a day (BID) | ORAL | Status: DC
  Administered 2019-03-01 – 2019-03-02 (×3): 8.6 mg via ORAL
  Filled 2019-03-01 (×4): qty 1

## 2019-03-01 MED ORDER — MAGNESIUM HYDROXIDE 400 MG/5ML PO SUSP: 30.0000 mL | Freq: Every day | ORAL | Status: DC | PRN

## 2019-03-01 NOTE — H&P (Signed)
[facility] History and Physical  Betty Carter ZOX:[id number] DOB: 26-Aug-1986 DOA: 03/01/2019 Referring physician: ED PCP: Patient, No Pcp Per  Chief Complaint: Abdominal pain, nausea, vomiting. ------------------------------------------------------------------------------------------------------ Assessment/Plan: Active Problems:   Choledocholithiasis  Dilated CBD/elevated liver enzymes -Suspect obstructed CBD.  Unclear cause of obstruction at this time.  GI consult appreciated.  MRCP ordered. -Trend liver enzymes, bilirubin and lipase level. -Patient states that was found to have elevated liver enzymes a month ago and had acute hepatitis panel, reportedly negative.  Liver enzymes were normal a year ago.  Cholelithiasis -At least one gallstone noted in gallbladder, no acute cholecystitis at this time. -May need surgical evaluation patient outpatient based on MRCP finding.  Recovering IV drug user -We will avoid oral or IV opioid.  ?  Autoimmune/immunological phenomenon -History of skin necrosis in 2019 attributed to vasculitis, history of transient renal failure that recovered with prednisone.  Now elevated liver enzymes 4 weeks.  Question an underlying rheumatologic/autoimmune/immunological phenomenon. 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  Also dermatology taken outpatient.  I do not find any nephrology note or evaluation though.  Renal function is stable at this time.  Mobility: Encourage ambulation Diet: N.p.o. for now DVT prophylaxis:  Lovenox starting tomorrow.  Await today just in case he needs to go to ERCP. Code Status:  Full code Disposition Plan:  Home ultimately. ? When -depends on further clinical course  ----------------------------------------------------------------------------------------------------- History of Present Illness: Betty Carter is a 33 y.o. 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  She presented to the ED this morning from home following a roughly 24-hour prodrome of severe upper abdominal pain. No fever, nauseous but no vomiting.  No diarrhea. No history of gallbladder disease. Uses birth control pills.  In the ED, patient was afebrile, hemodynamically stable. Labs showed normal renal function, but elevated liver enzymes alk phos/AST/ALT/total bili/lipase respectively to 134/588/741/4.6/59.  WBC count normal. Ultrasound right upper quadrant shows a 9 mm dilated CBD as well as biliary sludge and at least 1 stone in the gallbladder but no stones in the CBD.  Of note, patient mentions that she was admitted to [facility] in May last year for skin necrosis in her gluteal area unrelated to IV drug use and attributed to vasculitis.  6 months ago, she also had a transient kidney failure leading to 40 pounds of edema which responded to a course of oral prednisone.  She is not aware of any autoimmune/immunologic diagnosis and has not visited any rheumatologist so far.  She is a recovering IV drug user.  Last IV heroin use 6 months ago.  She was on Suboxone until 4 months ago.  She is strongly asking to avoid any opioid exposure in the hospital.  Review of Systems:  All systems were reviewed and were negative unless otherwise mentioned in the HPI  Past medical history: Skin necrosis in gluteal area and May 2019  Transient kidney failure in 2019  Past surgical history: History reviewed. No pertinent surgical history.  Social History:  reports that she has been smoking cigarettes. She has been smoking about 0.50 packs per day. She has never used smokeless tobacco. She reports previous drug use. She reports that she does not drink alcohol.  Allergies:  No Known Allergies  Family history:  History reviewed. No pertinent family history.   Home Meds: Prior to Admission medications   Medication Sig Start Date End Date Taking?  Authorizing Provider  lansoprazole (PREVACID) 30 MG capsule Take 30 mg by mouth daily as needed (Heart Burn).   Yes [provider]  Multiple Vitamin (MULTIVITAMIN WITH MINERALS) TABS tablet Take 1 tablet by mouth daily.   Yes [provider]  norethindrone (MICRONOR) 0.35 MG tablet Take 1 tablet by mouth daily.   Yes [provider]  furosemide (LASIX) 40 MG tablet Take 1 tablet (40 mg total) by mouth daily for 3 days. 08/13/18 08/16/18  Ok Edwards, PA-C    Physical Exam: Vitals:   03/01/19 0415 03/01/19 0530 03/01/19 0645 03/01/19 0705  BP: 136/89 110/80 103/65 115/83  Pulse: (!) 101 69 (!) 57 61  Resp: _0 Temp: 98.1 F (36.7 C)   98.1 F (36.7 C)  TempSrc: Oral   Oral  SpO2: 98% 98% 95% 98%  Weight:      Height:       Wt Readings from Last 3 Encounters:  03/01/19 63.5 kg  08/13/18 72.6 kg   Body mass index is 24.03 kg/m.  General exam: Appears calm and comfortable.  Pleasant young Caucasian female sitting up in bed.  Not in distress at this time Skin: No rashes, lesions or ulcers. HEENT: Normal Lungs: Clear to auscultation bilaterally CVS: Regular rate and rhythm, no murmur GI/Abd soft, mild tenderness in the right upper quadrant.  Bowel sound present CNS: Alert, awake, oriented x3 Psychiatry: Mood & affect appropriate.  Extremities: No pedal edema, no calf tenderness  Labs on Admission:   CBC: Recent Labs  Lab 03/01/19 0423  WBC 9.4  NEUTROABS 6.5  HGB 13.7  HCT 42.4  MCV 88.9  PLT 037    Basic Metabolic Panel: Recent Labs  Lab 03/01/19 0458  NA 138  K 3.7  CL 106  CO2 24  GLUCOSE 111*  BUN 13  CREATININE 0.70  CALCIUM 9.4    Liver Function Tests: Recent Labs  Lab 03/01/19 0458  AST 588*  ALT 741*  ALKPHOS 134*  BILITOT 4.6*  PROT 7.5  ALBUMIN 4.1   Recent Labs  Lab 03/01/19 0458  LIPASE 59*   No results for input(s): AMMONIA in the last 168 hours.  Cardiac Enzymes: No results for input(s):  CKTOTAL, CKMB, CKMBINDEX, TROPONINI in the last 168 hours.  BNP (last 3 results) No results for input(s): BNP in the last 8760 hours.  ProBNP (last 3 results) No results for input(s): PROBNP in the last 8760 hours.  CBG: No results for input(s): GLUCAP in the last 168 hours.  Lipase     Component Value Date/Time   LIPASE 59 (H) 03/01/2019 0458     Urinalysis    Component Value Date/Time   COLORURINE AMBER (A) 03/01/2019 0359   APPEARANCEUR HAZY (A) 03/01/2019 0359   LABSPEC 1.024 03/01/2019 0359   PHURINE 6.0 03/01/2019 0359   GLUCOSEU NEGATIVE 03/01/2019 0359   HGBUR LARGE (A) 03/01/2019 0359   BILIRUBINUR MODERATE (A) 03/01/2019 0359   KETONESUR NEGATIVE 03/01/2019 0359   PROTEINUR 100 (A) 03/01/2019 0359   UROBILINOGEN 0.2 08/13/2018 1732   NITRITE NEGATIVE 03/01/2019 0359   LEUKOCYTESUR NEGATIVE 03/01/2019 0359     Drugs of Abuse     Component Value Date/Time   LABOPIA NONE DETECTED 03/12/2018 1641   COCAINSCRNUR NONE DETECTED 03/12/2018 1641   LABBENZ POSITIVE (A) 03/12/2018 1641   AMPHETMU POSITIVE (A) 03/12/2018 1641   THCU NONE DETECTED 03/12/2018 1641   LABBARB NONE DETECTED 03/12/2018 1641  Radiological Exams on Admission: US Abdomen Limited  Result Date: 03/01/2019 CLINICAL DATA:  Right upper quadrant pain. EXAM: ULTRASOUND ABDOMEN LIMITED RIGHT UPPER QUADRANT COMPARISON:  None. FINDINGS: Gallbladder: There is a 5 mm stone in the gallbladder. There is also mild sludge. No wall thickening, pericholecystic fluid, or reported Murphy's sign. Common bile duct: Diameter: 9 mm Liver: No focal mass. Mild intrahepatic ductal dilatation. Portal vein is patent on color Doppler imaging with normal direction of blood flow towards the liver. IMPRESSION: 1. Intra and extrahepatic biliary ductal dilatation. The finding is concerning for biliary duct obstruction. Recommend correlation with labs. An MRCP or ERCP could better evaluate. 2. There is a 5 mm stone in the  gallbladder in addition to a small amount of sludge without wall thickening, pericholecystic fluid, or Murphy's sign. Electronically Signed   By: Dorise Bullion III M.D   On: 03/01/2019 05:06   ----------------------------------------------------------------------------------------------------------------------------------------------------------- Severity of Illness: The appropriate patient status for this patient is OBSERVATION. Observation status is judged to be reasonable and necessary in order to provide the required intensity of service to ensure the patient's safety. The patient's presenting symptoms, physical exam findings, and initial radiographic and laboratory data in the context of their medical condition is felt to place them at decreased risk for further clinical deterioration. Furthermore, it is anticipated that the patient will be medically stable for discharge from the hospital within 2 midnights of admission. The following factors support the patient status of observation.   " The patient's presenting symptoms include abdominal pain. " The physical exam findings include abdominal tenderness. " The initial radiographic and laboratory data are dilated CBD, elevated liver enzymes.    Signed, Terrilee Croak, MD [facility] 03/01/2019

## 2019-03-01 NOTE — Progress Notes (Signed)
Paged GI to clarify if patient is to have MRCP today. If not can I place patient on diet.

## 2019-03-01 NOTE — Progress Notes (Signed)
I spoke with Dr.Dahal, who stated patients MRCP be done today. I have called xray, radiology, MRI, and home numbers of MRI techs, Clifton Custard, and Aundra Millet but unable to reach anyone in regards to scheduling patients MRCP.

## 2019-03-01 NOTE — ED Notes (Signed)
ED TO INPATIENT HANDOFF REPORT  Name/Age/Gender Betty Carter 33 y.o. female  Code Status   Home/SNF/Other Home  Chief Complaint chest pain, abd pain   Level of Care/Admitting Diagnosis ED Disposition    ED Disposition Condition Comment   Admit  Hospital Area: [facility] [[id number]]  Level of Care: Med-Surg [16]  Covid Evaluation: N/A  Diagnosis: Choledocholithiasis [939030]  Admitting Physician: John Giovanni [[id number]  Attending Physician: John Giovanni [[id number]]  PT Class (Do Not Modify): Observation [104]  PT Acc Code (Do Not Modify): Observation [10022]       Medical History History reviewed. No pertinent past medical history.  Allergies No Known Allergies  IV Location/Drains/Wounds Patient Lines/Drains/Airways Status   Active Line/Drains/Airways    Name:   Placement date:   Placement time:   Site:   Days:   Peripheral IV 03/01/19 Left Hand   03/01/19    0506    Hand   less than 1          Labs/Imaging Results for orders placed or performed during the hospital encounter of 03/01/19 (from the past 48 hour(s))  Urinalysis, Routine w reflex microscopic     Status: Abnormal   Collection Time: 03/01/19  3:59 AM  Result Value Ref Range   Color, Urine AMBER (A) YELLOW    Comment: BIOCHEMICALS MAY BE AFFECTED BY COLOR   APPearance HAZY (A) CLEAR   Specific Gravity, Urine 1.024 1.005 - 1.030   pH 6.0 5.0 - 8.0   Glucose, UA NEGATIVE NEGATIVE mg/dL   Hgb urine dipstick LARGE (A) NEGATIVE   Bilirubin Urine MODERATE (A) NEGATIVE   Ketones, ur NEGATIVE NEGATIVE mg/dL   Protein, ur 354 (A) NEGATIVE mg/dL   Nitrite NEGATIVE NEGATIVE   Leukocytes,Ua NEGATIVE NEGATIVE   RBC / HPF >50 (H) 0 - 5 RBC/hpf   WBC, UA 6-10 0 - 5 WBC/hpf   Bacteria, UA FEW (A) NONE SEEN   Squamous Epithelial / LPF 11-20 0 - 5   Mucus PRESENT     Comment: Performed at [facility], [address] [place], [place] [address]  CBC with  Differential/Platelet     Status: None   Collection Time: 03/01/19  4:23 AM  Result Value Ref Range   WBC 9.4 4.0 - 10.5 K/uL   RBC 4.77 3.87 - 5.11 MIL/uL   Hemoglobin 13.7 12.0 - 15.0 g/dL   HCT 38.9 37.3 - 42.8 %   MCV 88.9 80.0 - 100.0 fL   MCH 28.7 26.0 - 34.0 pg   MCHC 32.3 30.0 - 36.0 g/dL   RDW 76.8 11.5 - 72.6 %   Platelets 348 150 - 400 K/uL   nRBC 0.0 0.0 - 0.2 %   Neutrophils Relative % 68 %   Neutro Abs 6.5 1.7 - 7.7 K/uL   Lymphocytes Relative 19 %   Lymphs Abs 1.7 0.7 - 4.0 K/uL   Monocytes Relative 10 %   Monocytes Absolute 1.0 0.1 - 1.0 K/uL   Eosinophils Relative 1 %   Eosinophils Absolute 0.1 0.0 - 0.5 K/uL   Basophils Relative 1 %   Basophils Absolute 0.1 0.0 - 0.1 K/uL   Immature Granulocytes 1 %   Abs Immature Granulocytes 0.07 0.00 - 0.07 K/uL    Comment: Performed at [facility], [address], [place], [place] [address]  Lipase, blood     Status: Abnormal   Collection Time: 03/01/19  4:58 AM  Result Value Ref Range  Lipase 59 (H) 11 - 51 U/L    Comment: Performed at [facility], [address] [place], [place] [address]  Comprehensive metabolic panel     Status: Abnormal   Collection Time: 03/01/19  4:58 AM  Result Value Ref Range   Sodium 138 135 - 145 mmol/L   Potassium 3.7 3.5 - 5.1 mmol/L   Chloride 106 98 - 111 mmol/L   CO2 24 22 - 32 mmol/L   Glucose, Bld 111 (H) 70 - 99 mg/dL   BUN 13 6 - 20 mg/dL   Creatinine, Ser 6.57 0.44 - 1.00 mg/dL   Calcium 9.4 8.9 - 84.6 mg/dL   Total Protein 7.5 6.5 - 8.1 g/dL   Albumin 4.1 3.5 - 5.0 g/dL   AST 962 (H) 15 - 41 U/L   ALT 741 (H) 0 - 44 U/L   Alkaline Phosphatase 134 (H) 38 - 126 U/L   Total Bilirubin 4.6 (H) 0.3 - 1.2 mg/dL   GFR calc non Af Amer >60 >60 mL/min   GFR calc Af Amer >60 >60 mL/min   Anion gap 8 5 - 15    Comment: Performed at [facility], [address], [place], [place] [address]  I-Stat beta hCG blood, ED      Status: None   Collection Time: 03/01/19  5:05 AM  Result Value Ref Range   I-stat hCG, quantitative <5.0 <5 mIU/mL   Comment 3            Comment:   GEST. AGE      CONC.  (mIU/mL)   <=1 WEEK        5 - 50     2 WEEKS       50 - 500     3 WEEKS       100 - 10,000     4 WEEKS     1,000 - 30,000        FEMALE AND NON-PREGNANT FEMALE:     LESS THAN 5 mIU/mL    US Abdomen Limited  Result Date: 03/01/2019 CLINICAL DATA:  Right upper quadrant pain. EXAM: ULTRASOUND ABDOMEN LIMITED RIGHT UPPER QUADRANT COMPARISON:  None. FINDINGS: Gallbladder: There is a 5 mm stone in the gallbladder. There is also mild sludge. No wall thickening, pericholecystic fluid, or reported Murphy's sign. Common bile duct: Diameter: 9 mm Liver: No focal mass. Mild intrahepatic ductal dilatation. Portal vein is patent on color Doppler imaging with normal direction of blood flow towards the liver. IMPRESSION: 1. Intra and extrahepatic biliary ductal dilatation. The finding is concerning for biliary duct obstruction. Recommend correlation with labs. An MRCP or ERCP could better evaluate. 2. There is a 5 mm stone in the gallbladder in addition to a small amount of sludge without wall thickening, pericholecystic fluid, or Murphy's sign. Electronically Signed   By: Gerome Sam III M.D   On: 03/01/2019 05:06    Pending Labs Unresulted Labs (From admission, onward)   None      Vitals/Pain Today's Vitals   03/01/19 0415 03/01/19 0530 03/01/19 0644 03/01/19 0645  BP: 136/89 110/80  103/65  Pulse: (!) 101 69  (!) 57  Resp: Temp: 98.1 F (36.7 C)     TempSrc: Oral     SpO2: 98% 98%  95%  Weight:      Height:      PainSc:   2      Isolation Precautions No active  isolations  Medications Medications  sodium chloride flush (NS) 0.9 % injection 3 mL (has no administration in time range)  pantoprazole (PROTONIX) injection 40 mg (40 mg Intravenous Given 03/01/19 0457)  ondansetron (ZOFRAN) injection 4 mg (4  mg Intravenous Given 03/01/19 0457)    Mobility walks

## 2019-03-01 NOTE — ED Triage Notes (Addendum)
Patient is complaining of mid chest pain and upper abdominal pain. Patient states she is having nausea and vomiting also. Patient states it started real bad last night. Patient is an IV drug user. Patient states she is 4 months clean.

## 2019-03-01 NOTE — Consult Note (Signed)
Referring Provider:  Dr. Lucky Cowboy Primary Care Physician:  Patient, No Pcp Per Primary Gastroenterologist: None (unassigned)  Reason for Consultation: Gallstones and elevated liver chemistries  HPI: Betty Carter is a 33 y.o. female admitted through the emergency room today following a roughly 24-hour prodrome of severe upper abdominal pain which kept her up most of the night before last.  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  There is no known family history of gallbladder disease.  The patient herself, however, does use birth control pills.  In the emergency room, the patient was found to have significant elevation of liver chemistries, specifically AST 588, ALT 741, bilirubin 4.6.  Lipase was essentially normal at 59.  Liver chemistries were completely normal when checked a year ago (on epic), but she does show me a printout from her primary physician where, in January of this year, liver chemistries were slightly elevated with AST 32 and ALT 120.  She states she was checked for hepatitis last month and it was negative.  The patient had an ultrasound through the emergency room that shows a 9 mm common bile duct as well as sludge and at least 1 stone in the gallbladder, but specific common duct stone was not visualized.  Clinically, the patient is feeling much better at this time following some antinausea medication.  During this illness, her urine did turn dark but has subsequently lightened up as of this morning.  She never experienced fevers or shaking chills.  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  It appears that both of these issues are currently quiescent.     History reviewed. No pertinent past medical history.  History reviewed. No pertinent surgical history.  Prior to Admission medications   Medication Sig Start Date End Date Taking? Authorizing Provider  lansoprazole (PREVACID) 30 MG capsule Take 30 mg by mouth daily as needed (Heart Burn).   Yes [provider]  Multiple Vitamin (MULTIVITAMIN WITH MINERALS) TABS tablet Take 1 tablet by mouth daily.   Yes [provider]  norethindrone (MICRONOR) 0.35 MG tablet Take 1 tablet by mouth daily.   Yes [provider]  furosemide (LASIX) 40 MG tablet Take 1 tablet (40 mg total) by mouth daily for 3 days. 08/13/18 08/16/18  Belinda Fisher, PA-C    Current Facility-Administered Medications  Medication Dose Route Frequency Provider Last Rate Last Dose  . acetaminophen (TYLENOL) tablet 650 mg  650 mg Oral Q6H PRN Dahal, Melina Schools, MD       Or  . acetaminophen (TYLENOL) suppository 650 mg  650 mg Rectal Q6H PRN Dahal, Melina Schools, MD      . Melene Muller ON 03/02/2019] enoxaparin (LOVENOX) injection 40 mg  40 mg Subcutaneous Q24H Dahal, Binaya, MD      . magnesium hydroxide (MILK OF MAGNESIA) suspension 30 mL  30 mL Oral Daily PRN Dahal, Binaya, MD      . ondansetron (ZOFRAN) tablet 4 mg  4 mg Oral Q6H PRN Dahal, Melina Schools, MD       Or  . ondansetron (ZOFRAN) injection 4 mg  4 mg Intravenous Q6H PRN Dahal, Melina Schools, MD      . senna (SENOKOT) tablet 8.6 mg  1 tablet Oral BID Dahal, Binaya, MD      . sodium chloride flush (NS) 0.9 % injection 3 mL  3 mL Intravenous Once Dahal, Binaya, MD      . sodium chloride flush (NS) 0.9 % injection 3 mL  3 mL Intravenous Q12H Dahal, Binaya, MD      . sodium phosphate (FLEET) 7-19 GM/118ML enema 1 enema  1 enema Rectal Once PRN  Dahal, Binaya, MD      . sorbitol 70 % solution 30 mL  30 mL Oral Daily PRN Lorin Glass, MD        Allergies as of 03/01/2019  . (No Known Allergies)    History reviewed. No pertinent family history.  Social History   Socioeconomic History  . Marital status: Single    Spouse name: Not on file  . Number of children: Not on file  . Years of education: Not on file  . Highest education level: Not on file  Occupational History  . Not on file  Social Needs  . Financial resource strain: Not on file  . Food insecurity:    Worry: Not on file    Inability: Not on file  . Transportation needs:    Medical: Not on file    Non-medical: Not on file  Tobacco Use  . Smoking status: Current Every Day Smoker    Packs/day: 0.50    Types: Cigarettes  . Smokeless tobacco: Never Used  Substance and Sexual Activity  . Alcohol use: Never    Frequency: Never  . Drug use: Not Currently  . Sexual activity: Not on file  Lifestyle  . Physical activity:    Days per week: Not on file    Minutes per session: Not on file  . Stress: Not on file  Relationships  . Social connections:    Talks on phone: Not on file    Gets together: Not on file    Attends religious service: Not on file    Active member of club or organization: Not on file    Attends meetings of clubs or organizations: Not on file    Relationship status: Not on file  . Intimate partner violence:    Fear of current or ex partner: Not on file    Emotionally abused: Not on file    Physically abused: Not on file    Forced sexual activity: Not on file  Other Topics Concern  . Not on file  Social History Narrative  . Not on file    Review of Systems: Negative for chest pain, cough, dyspnea, headache, dysphagia, reflux, urinary symptoms, current skin problems, current arthropathy, involuntary weight loss  Physical Exam: Vital signs in last 24 hours: Temp:  [98.1 F (36.7 C)] 98.1 F (36.7 C) (04/25 0705) Pulse Rate:   [57-101] 61 (04/25 0705) Resp:  [12-16] 16 (04/25 0705) BP: (103-136)/(65-89) 115/83 (04/25 0705) SpO2:  [95 %-98 %] 98 % (04/25 0705) Weight:  [63.5 kg] 63.5 kg (04/25 0405) Last BM Date: 02/28/19 General:   Alert,  Fairly thin Caucasian female, pleasant and cooperative in NAD Head:  Normocephalic and atraumatic. Eyes:  Sclera clear, no icterus.   Conjunctiva pink. Mouth:   No ulcerations or lesions.  Oropharynx pink &  moist. Lungs:  Clear throughout to auscultation.   No wheezes, crackles, or rhonchi. No evident respiratory distress. Heart:   Regular rate and rhythm; no murmurs, clicks, rubs,  or gallops. Abdomen:  Soft, essentially nontender, and nondistended.  There is mild subjective tenderness to palpation of the right upper quadrant, but no exquisite tenderness.  No masses, hepatosplenomegaly or ventral hernias noted.  Quiet bowel sounds, without bruits, guarding, or rebound.   Msk:   Symmetrical without gross deformities. Extremities:   Without edema. Neurologic:  Alert and coherent;  grossly normal neurologically. Skin: Postinflammatory scarring in left gluteal cheek and left posterior thigh.  Multiple tattoos present. Psych:   Alert and cooperative. Normal mood and affect.  Intake/Output from previous day: No intake/output data recorded. Intake/Output this shift: No intake/output data recorded.  Lab Results: Recent Labs    03/01/19 0423  WBC 9.4  HGB 13.7  HCT 42.4  PLT 348   BMET Recent Labs    03/01/19 0458  NA 138  K 3.7  CL 106  CO2 24  GLUCOSE 111*  BUN 13  CREATININE 0.70  CALCIUM 9.4   LFT Recent Labs    03/01/19 0458  PROT 7.5  ALBUMIN 4.1  AST 588*  ALT 741*  ALKPHOS 134*  BILITOT 4.6*   PT/INR No results for input(s): LABPROT, INR in the last 72 hours.  Studies/Results: US Abdomen Limited  Result Date: 03/01/2019 CLINICAL DATA:  Right upper quadrant pain. EXAM: ULTRASOUND ABDOMEN LIMITED RIGHT UPPER QUADRANT COMPARISON:  None.  FINDINGS: Gallbladder: There is a 5 mm stone in the gallbladder. There is also mild sludge. No wall thickening, pericholecystic fluid, or reported Murphy's sign. Common bile duct: Diameter: 9 mm Liver: No focal mass. Mild intrahepatic ductal dilatation. Portal vein is patent on color Doppler imaging with normal direction of blood flow towards the liver. IMPRESSION: 1. Intra and extrahepatic biliary ductal dilatation. The finding is concerning for biliary duct obstruction. Recommend correlation with labs. An MRCP or ERCP could better evaluate. 2. There is a 5 mm stone in the gallbladder in addition to a small amount of sludge without wall thickening, pericholecystic fluid, or Murphy's sign. Electronically Signed   By: Gerome Sam III M.D   On: 03/01/2019 05:06    Impression: 1.  Cholelithiasis 2.  Biliary ductal dilatation to 9 mm, with significant elevation of liver chemistries over baseline 3.  History of mild elevation of liver chemistries as of 3 months ago, presumably related to medication exposure (hepatitis test last month reportedly negative), baseline liver chemistries a year ago were normal. 4.  Past history of renal dysfunction and severe edema responsive to prednisone, by patient's report, details not available. 5.  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 6.  History of opiate addiction, in recovery  Discussion: The abrupt onset of upper abdominal pain, biliary dilatation, and significant elevation liver chemistries is strongly suggestive of choledocholithiasis superimposed on the patient's known cholelithiasis.  However, the patient's picture is clouded by a history of antecedent elevation of liver chemistries, as well as the prompt resolution of symptoms, which raises the question as to whether or not she may have passed a common duct stone.  Plan: I have discussed the case with the patient's attending physician, Dr. Pola Corn, and with Dr. Stan Head who is  the ERCP proceduralist this weekend.  We are all in favor of proceeding with MRCP as our next step, with consideration for ERCP thereafter if a stone is identified in the  common bile duct.  I have explained to the patient the pathophysiology of choledocholithiasis and different methods of evaluation and treatment, and I went into the nature, purpose, and risks of ERCP with sphincterotomy and stone extraction, including perhaps a 3% risk of pancreatitis with rare mortality, anesthesia problems, bleeding, perforation, and infection.  In the meantime, I will put the patient on for repeat labs tomorrow and I will go ahead and check an updated acute hepatitis profile.   LOS: 0 days   Katy FitchRobert V Kymir Coles  03/01/2019, 8:50 AM   Pager [phone] If no answer or after 5 PM call [phone]

## 2019-03-01 NOTE — ED Provider Notes (Signed)
[facility] DEPT Provider Note   CSN: [id number] Arrival date & time: 03/01/19  0357    History   Chief Complaint Chief Complaint  Patient presents with  . Chest Pain  . Abdominal Pain    HPI Betty Carter is a 33 y.o. female.     Patient presents to the emergency department with a chief complaint of epigastric and right upper quadrant abdominal pain.  She states the symptoms started last evening.  She reports associated nausea and vomiting.  She denies any fevers or chills.  Denies any central chest pain or shortness of breath.  She denies any lower abdominal pain, dysuria, or hematuria.  She has not taken anything for symptoms.  Of note, patient is a recovering IV drug user, and would prefer not to be given any narcotic pain medicine.  The history is provided by the patient. No language interpreter was used.    History reviewed. No pertinent past medical history.  Patient Active Problem List   Diagnosis Date Noted  . Choledocholithiasis 03/01/2019    History reviewed. No pertinent surgical history.   OB History   No obstetric history on file.      Home Medications    Prior to Admission medications   Medication Sig Start Date End Date Taking? Authorizing Provider  lansoprazole (PREVACID) 30 MG capsule Take 30 mg by mouth daily as needed (Heart Burn).   Yes [provider]  Multiple Vitamin (MULTIVITAMIN WITH MINERALS) TABS tablet Take 1 tablet by mouth daily.   Yes [provider]  norethindrone (MICRONOR) 0.35 MG tablet Take 1 tablet by mouth daily.   Yes [provider]  furosemide (LASIX) 40 MG tablet Take 1 tablet (40 mg total) by mouth daily for 3 days. 08/13/18 08/16/18  Ok Edwards, PA-C    Family History History reviewed. No pertinent family history.  Social History Social History   Tobacco Use  . Smoking status: Current Every Day Smoker    Packs/day: 0.50    Types: Cigarettes  . Smokeless  tobacco: Never Used  Substance Use Topics  . Alcohol use: Never    Frequency: Never  . Drug use: Not Currently     Allergies   Patient has no known allergies.   Review of Systems Review of Systems  All other systems reviewed and are negative.    Physical Exam Updated Vital Signs BP 110/80   Pulse 69   Temp 98.1 F (36.7 C) (Oral)   Resp 12   Ht _0  (1.626 m)   Wt 63.5 kg   LMP 01/22/2019   SpO2 98%   BMI 24.03 kg/m   Physical Exam Vitals signs and nursing note reviewed.  Constitutional:      General: She is not in acute distress.    Appearance: She is well-developed.  HENT:     Head: Normocephalic and atraumatic.  Eyes:     Conjunctiva/sclera: Conjunctivae normal.  Neck:     Musculoskeletal: Neck supple.  Cardiovascular:     Rate and Rhythm: Normal rate and regular rhythm.     Heart sounds: No murmur.  Pulmonary:     Effort: Pulmonary effort is normal. No respiratory distress.     Breath sounds: Normal breath sounds.  Abdominal:     Palpations: Abdomen is soft.     Tenderness: There is abdominal tenderness.     Comments: Right upper quadrant tender to palpation  Skin:    General: Skin is warm and  dry.  Neurological:     Mental Status: She is alert and oriented to person, place, and time.  Psychiatric:        Mood and Affect: Mood normal.        Behavior: Behavior normal.        Thought Content: Thought content normal.        Judgment: Judgment normal.      ED Treatments / Results  Labs (all labs ordered are listed, but only abnormal results are displayed) Labs Reviewed  LIPASE, BLOOD - Abnormal; Notable for the following components:      Result Value   Lipase 59 (*)    All other components within normal limits  COMPREHENSIVE METABOLIC PANEL - Abnormal; Notable for the following components:   Glucose, Bld 111 (*)    AST 588 (*)    ALT 741 (*)    Alkaline Phosphatase 134 (*)    Total Bilirubin 4.6 (*)    All other components within  normal limits  URINALYSIS, ROUTINE W REFLEX MICROSCOPIC - Abnormal; Notable for the following components:   Color, Urine AMBER (*)    APPearance HAZY (*)    Hgb urine dipstick LARGE (*)    Bilirubin Urine MODERATE (*)    Protein, ur 100 (*)    RBC / HPF >50 (*)    Bacteria, UA FEW (*)    All other components within normal limits  CBC WITH DIFFERENTIAL/PLATELET  I-STAT BETA HCG BLOOD, ED ([facility], [facility], [facility] ONLY)    EKG None  Radiology US Abdomen Limited  Result Date: 03/01/2019 CLINICAL DATA:  Right upper quadrant pain. EXAM: ULTRASOUND ABDOMEN LIMITED RIGHT UPPER QUADRANT COMPARISON:  None. FINDINGS: Gallbladder: There is a 5 mm stone in the gallbladder. There is also mild sludge. No wall thickening, pericholecystic fluid, or reported Murphy's sign. Common bile duct: Diameter: 9 mm Liver: No focal mass. Mild intrahepatic ductal dilatation. Portal vein is patent on color Doppler imaging with normal direction of blood flow towards the liver. IMPRESSION: 1. Intra and extrahepatic biliary ductal dilatation. The finding is concerning for biliary duct obstruction. Recommend correlation with labs. An MRCP or ERCP could better evaluate. 2. There is a 5 mm stone in the gallbladder in addition to a small amount of sludge without wall thickening, pericholecystic fluid, or Murphy's sign. Electronically Signed   By: Dorise Bullion III M.D   On: 03/01/2019 05:06    Procedures Procedures (including critical care time)  Medications Ordered in ED Medications  sodium chloride flush (NS) 0.9 % injection 3 mL (has no administration in time range)  pantoprazole (PROTONIX) injection 40 mg (40 mg Intravenous Given 03/01/19 0457)  ondansetron (ZOFRAN) injection 4 mg (4 mg Intravenous Given 03/01/19 0457)     Initial Impression / Assessment and Plan / ED Course  I have reviewed the triage vital signs and the nursing notes.  Pertinent labs & imaging results that were available during my care of the patient were  reviewed by me and considered in my medical decision making (see chart for details).        With right upper quadrant pain.  Will check labs and ultrasound.  Patient is afebrile.  Vital signs are stable.  Ultrasound shows biliary ductal dilatation.  There is no visualized stone in the common bile duct.  She does have a 5 mm stone in her gallbladder with some gallbladder sludge, but there is no gallbladder wall thickening, or evidence of acute cholecystitis.  Laboratory work-up shows significantly  elevated LFTs, AST is 588, ALT 741, alk phos 134, T bili 4.6.  Will consult hospitalist for admission for suspected choledocholithiasis.  Case discussed with Dr. Cristina Gong from [facility] gastroenterology, who states that he will see the patient in consultation.  Anticipates that given the current COVID pandemic, patient will likely benefit from MRCP prior to possible ERCP.  Final Clinical Impressions(s) / ED Diagnoses   Final diagnoses:  Cholelithiasis with choledocholithiasis    ED Discharge Orders    None       Montine Circle, PA-C 03/01/19 8916    Mesner, Corene Cornea, MD 03/02/19 [phone]

## 2019-03-02 ENCOUNTER — Observation Stay (HOSPITAL_COMMUNITY): Payer: Self-pay

## 2019-03-02 DIAGNOSIS — R740 Nonspecific elevation of levels of transaminase and lactic acid dehydrogenase [LDH]: Secondary | ICD-10-CM

## 2019-03-02 DIAGNOSIS — K8070 Calculus of gallbladder and bile duct without cholecystitis without obstruction: Secondary | ICD-10-CM

## 2019-03-02 DIAGNOSIS — R7401 Elevation of levels of liver transaminase levels: Secondary | ICD-10-CM | POA: Diagnosis present

## 2019-03-02 LAB — CBC
HCT: 36.3 % (ref 36.0–46.0)
Hemoglobin: 11.6 g/dL — ABNORMAL LOW (ref 12.0–15.0)
MCH: 28.8 pg (ref 26.0–34.0)
MCHC: 32 g/dL (ref 30.0–36.0)
MCV: 90.1 fL (ref 80.0–100.0)
Platelets: 268 10*3/uL (ref 150–400)
RBC: 4.03 MIL/uL (ref 3.87–5.11)
RDW: 12.2 % (ref 11.5–15.5)
WBC: 6.6 10*3/uL (ref 4.0–10.5)
nRBC: 0 % (ref 0.0–0.2)

## 2019-03-02 LAB — HEPATITIS PANEL, ACUTE
HCV Ab: <0.1 s/co ratio (ref 0.0–0.9)
Hep A IgM: NEGATIVE
Hep B C IgM: NEGATIVE
Hepatitis B Surface Ag: NEGATIVE

## 2019-03-02 LAB — BASIC METABOLIC PANEL
Anion gap: 8 (ref 5–15)
BUN: 14 mg/dL (ref 6–20)
CO2: 23 mmol/L (ref 22–32)
Calcium: 8.7 mg/dL — ABNORMAL LOW (ref 8.9–10.3)
Chloride: 108 mmol/L (ref 98–111)
Creatinine, Ser: 0.68 mg/dL (ref 0.44–1.00)
GFR calc Af Amer: >60 mL/min (ref >60)
GFR calc non Af Amer: >60 mL/min (ref >60)
Glucose, Bld: 93 mg/dL (ref 70–99)
Potassium: 3.8 mmol/L (ref 3.5–5.1)
Sodium: 139 mmol/L (ref 135–145)

## 2019-03-02 LAB — HEPATIC FUNCTION PANEL
ALT: 426 U/L — ABNORMAL HIGH (ref 0–44)
AST: 210 U/L — ABNORMAL HIGH (ref 15–41)
Albumin: 3.3 g/dL — ABNORMAL LOW (ref 3.5–5.0)
Alkaline Phosphatase: 124 U/L (ref 38–126)
Bilirubin, Direct: 2.7 mg/dL — ABNORMAL HIGH (ref 0.0–0.2)
Indirect Bilirubin: 1.5 mg/dL — ABNORMAL HIGH (ref 0.3–0.9)
Total Bilirubin: 4.2 mg/dL — ABNORMAL HIGH (ref 0.3–1.2)
Total Protein: 5.9 g/dL — ABNORMAL LOW (ref 6.5–8.1)

## 2019-03-02 MED ORDER — GADOBUTROL 1 MMOL/ML IV SOLN
7.0000 mL | Freq: Once | INTRAVENOUS | Status: AC | PRN
  Administered 2019-03-02: 09:00:00 7 mL via INTRAVENOUS

## 2019-03-02 MED ORDER — CEFAZOLIN SODIUM-DEXTROSE 2-4 GM/100ML-% IV SOLN: 2.0000 g | INTRAVENOUS | Status: DC

## 2019-03-02 MED ORDER — ACETAMINOPHEN 500 MG PO TABS: 1000.0000 mg | ORAL_TABLET | ORAL | Status: DC

## 2019-03-02 MED ORDER — CELECOXIB 200 MG PO CAPS: 200.0000 mg | ORAL_CAPSULE | ORAL | Status: DC

## 2019-03-02 MED ORDER — GABAPENTIN 300 MG PO CAPS: 300.0000 mg | ORAL_CAPSULE | ORAL | Status: DC

## 2019-03-02 MED ORDER — CHLORHEXIDINE GLUCONATE CLOTH 2 % EX PADS: 6.0000 | MEDICATED_PAD | Freq: Once | CUTANEOUS | Status: DC

## 2019-03-02 MED ORDER — CHLORHEXIDINE GLUCONATE CLOTH 2 % EX PADS
6.0000 | MEDICATED_PAD | Freq: Once | CUTANEOUS | Status: AC
  Administered 2019-03-02: 6 via TOPICAL

## 2019-03-02 NOTE — Progress Notes (Addendum)
Problems: Abdominal pain, cholelithiasis, elevated liver chemistries, history of baseline mild liver chemistry elevation, past history of opiate addiction  S: Feeling great.  No further attacks of abdominal pain, no nausea, feels hungry, tolerated low-fat diet last night without difficulty.  Mild residual upper abdominal discomfort.  O:   --Transaminases have fallen about 50% over the past 24 hours, but are still significantly elevated in the 200-400 range.  Bilirubin remains with stable elevation around 4.2 white count remains normal.    --Hepatitis panel negative.  --MRCP negative for choledocholithiasis.  Interestingly, the gallstone noted on her ultrasound and her CT scan was not seen on the MRI of the abdomen.  --On exam, the patient is sitting up, very chipper and good spirits, absolutely no distress.  Minimal to mild discomfort to palpation of upper abdomen, no overt tenderness.  Afebrile.  No jaundice.  A: No evidence of cholecystitis or choledocholithiasis or viral hepatitis.    P:  Discussed case with Dr. Stan Head and he and I agree that the patient probably passed a common duct stone, and that there is no current indication for ERCP.  I discussed the case with Dr. Carman Ching of surgery, who does feel that the patient should probably have a cholecystectomy during this hospitalization, taking into account that elective surgery may be difficult to schedule once the operating rooms are opened up following closure from the COVID pandemic.  We will allow the patient a low-fat diet today, and keep her n.p.o. after midnight with anticipation of surgical consultation tomorrow, and the possibility of her having a cholecystectomy tomorrow.  I explained all this to the patient and she is agreeable.  Florencia Reasons, M.D. Pager [phone] If no answer or after 5 PM call [phone]

## 2019-03-02 NOTE — Progress Notes (Signed)
[facility]                                                                              Patient Demographics  Betty Carter, is a 33 y.o. female, DOB - [birth year], JXB:[id number]  Admit date - 03/01/2019   Admitting Physician Lorin Glass, MD  Outpatient Primary MD for the patient is Patient, No Pcp Per  Outpatient specialists:   LOS - 0  days   Medical records reviewed and are as summarized below:    Chief Complaint  Patient presents with  . Chest Pain  . 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  No fever chills, no diarrhea.  Nausea but no vomiting. In ED patient was afebrile, normal renal function but elevated liver enzymes.  Ultrasound gallbladder showed a 9 mm dilated CBD, 1 stone in the gallbladder.   Assessment & Plan    Principal Problem:   Choledocholithiasis, dilated CBD with elevated liver enzymes -Likely due to obstructive CBD, GI consulted, may need surgical evaluation. -Hepatitis panel negative -MRCP planned, may need ERCP today.  N.p.o. -Continue IV fluid hydration  Active Problems:   Transaminitis -Hepatitis panel negative, MRCP planned today   History of IV drug abuse in the past -Currently recovering and doing well, encouraged patient -Patient strongly requested no opiates during the hospitalization  Code Status: Full code DVT Prophylaxis: Lovenox Family Communication: Discussed in detail with the patient, all imaging results, lab results explained to the patient    Disposition Plan: Once cleared by GI  Time Spent in minutes   25 minutes  Procedures:  Right upper quadrant ultrasound  Consultants:   Gastroenterology  Antimicrobials:   Anti-infectives (From admission, onward)   None         Medications  Scheduled Meds: . enoxaparin (LOVENOX) injection  40 mg  Subcutaneous Q24H  . senna  1 tablet Oral BID   Continuous Infusions: . sodium chloride 100 mL/hr at 03/02/19 0200   PRN Meds:.acetaminophen **OR** acetaminophen, gadobutrol, magnesium hydroxide, ondansetron **OR** ondansetron (ZOFRAN) IV, sodium phosphate, sorbitol      Subjective:   Betty Carter was seen and examined today.  Feels a whole lot better, right upper quadrant abdominal pain 2/10.  Afebrile, no nausea or vomiting. Patient denies dizziness, chest pain, shortness of breath, new weakness, numbess, tingling. No acute events overnight.    Objective:   Vitals:   03/01/19 0530 03/01/19 0645 03/01/19 0705 03/02/19 0532  BP: 110/80 103/65 115/83 101/62  Pulse: 69 (!) 57 61 (!) 57  Resp: Temp:   98.1 F (36.7 C) 98.3 F (36.8 C)  TempSrc:   Oral Oral  SpO2: 98% 95% 98% 98%  Weight:      Height:        Intake/Output Summary (Last 24 hours) at 03/02/2019 0912 Last data filed at 03/02/2019 0200 Gross per 24 hour  Intake 700 ml  Output -  Net 700 ml  Wt Readings from Last 3 Encounters:  03/01/19 63.5 kg  08/13/18 72.6 kg     Exam  General: Alert and oriented x 3, NAD  Eyes:   HEENT:  Atraumatic, normocephalic  Cardiovascular: S1 S2 auscultated, Regular rate and rhythm.  Respiratory: Clear to auscultation bilaterally, no wheezing, rales or rhonchi  Gastrointestinal: Soft, mild RUQ TTP, nondistended, + bowel sounds  Ext: no pedal edema bilaterally  Neuro: No neurological deficits  Musculoskeletal: No digital cyanosis, clubbing  Skin: No rashes  Psych: Normal affect and demeanor, alert and oriented x3    Data Reviewed:  I have personally reviewed following labs and imaging studies  Micro Results No results found for this or any previous visit (from the past 240 hour(s)).  Radiology Reports [place] Abdomen Limited  Result Date: 03/01/2019 CLINICAL DATA:  Right upper quadrant pain. EXAM: ULTRASOUND ABDOMEN LIMITED RIGHT UPPER  QUADRANT COMPARISON:  None. FINDINGS: Gallbladder: There is a 5 mm stone in the gallbladder. There is also mild sludge. No wall thickening, pericholecystic fluid, or reported Murphy's sign. Common bile duct: Diameter: 9 mm Liver: No focal mass. Mild intrahepatic ductal dilatation. Portal vein is patent on color Doppler imaging with normal direction of blood flow towards the liver. IMPRESSION: 1. Intra and extrahepatic biliary ductal dilatation. The finding is concerning for biliary duct obstruction. Recommend correlation with labs. An MRCP or ERCP could better evaluate. 2. There is a 5 mm stone in the gallbladder in addition to a small amount of sludge without wall thickening, pericholecystic fluid, or Murphy's sign. Electronically Signed   By: Gerome Samavid  Williams III M.D   On: 03/01/2019 05:06    Lab Data:  CBC: Recent Labs  Lab 03/01/19 0423 03/02/19 0330  WBC 9.4 6.6  NEUTROABS 6.5  --   HGB 13.7 11.6*  HCT 42.4 36.3  MCV 88.9 90.1  PLT 348 268   Basic Metabolic Panel: Recent Labs  Lab 03/01/19 0458 03/02/19 0330  NA 138 139  K 3.7 3.8  CL 106 108  CO2 24 23  GLUCOSE 111* 93  BUN 13 14  CREATININE 0.70 0.68  CALCIUM 9.4 8.7*   GFR: Estimated Creatinine Clearance: 88 mL/min (by C-G formula based on SCr of 0.68 mg/dL). Liver Function Tests: Recent Labs  Lab 03/01/19 0458 03/02/19 0330  AST 588* 210*  ALT 741* 426*  ALKPHOS 134* 124  BILITOT 4.6* 4.2*  PROT 7.5 5.9*  ALBUMIN 4.1 3.3*   Recent Labs  Lab 03/01/19 0458  LIPASE 59*   No results for input(s): AMMONIA in the last 168 hours. Coagulation Profile: No results for input(s): INR, PROTIME in the last 168 hours. Cardiac Enzymes: No results for input(s): CKTOTAL, CKMB, CKMBINDEX, TROPONINI in the last 168 hours. BNP (last 3 results) No results for input(s): PROBNP in the last 8760 hours. HbA1C: No results for input(s): HGBA1C in the last 72 hours. CBG: No results for input(s): GLUCAP in the last 168 hours.  Lipid Profile: No results for input(s): CHOL, HDL, LDLCALC, TRIG, CHOLHDL, LDLDIRECT in the last 72 hours. Thyroid Function Tests: No results for input(s): TSH, T4TOTAL, FREET4, T3FREE, THYROIDAB in the last 72 hours. Anemia Panel: No results for input(s): VITAMINB12, FOLATE, FERRITIN, TIBC, IRON, RETICCTPCT in the last 72 hours. Urine analysis:    Component Value Date/Time   COLORURINE AMBER (A) 03/01/2019 0359   APPEARANCEUR HAZY (A) 03/01/2019 0359   LABSPEC 1.024 03/01/2019 0359   PHURINE 6.0 03/01/2019 0359   GLUCOSEU NEGATIVE 03/01/2019 0359   HGBUR  LARGE (A) 03/01/2019 0359   BILIRUBINUR MODERATE (A) 03/01/2019 0359   KETONESUR NEGATIVE 03/01/2019 0359   PROTEINUR 100 (A) 03/01/2019 0359   UROBILINOGEN 0.2 08/13/2018 1732   NITRITE NEGATIVE 03/01/2019 0359   LEUKOCYTESUR NEGATIVE 03/01/2019 0359       M.D. [facility] 03/02/2019, 9:12 AM  Pager: [phone] Between 7am to 7pm - call Pager - [phone]  After 7pm go to [email or web address] - password [email or web address]  Call night coverage person covering after 7pm

## 2019-03-02 NOTE — Progress Notes (Signed)
I have made the patient NPO in case she needs ERCP today MRCP still not done.   Iva Boop, MD, [facility] Gastroenter[facility] 03/02/2019 6:44 AM Pager [phone]

## 2019-03-02 NOTE — Consult Note (Signed)
Reason for Consult:symptomatic gallstones Referring Physician: Dr. Carlena Bjornstad Betty Carter is an 33 y.o. female.  HPI: This is a 33 year old female admitted yesterday with severe upper abdominal pain and nausea.  Describes a constant sharp pain.  Is found to have elevated LFTs.  She has since had an MRCP which did not show a stone in the common bile duct.  Ultrasound did show dilated intra-and extra biliary ducts and one stone in the gallbladder.  The patient now is pain-free and feels well.  She is uncertain if she has had another Aller attack in the past.  She does have a history of IV drug abuse but has been clean for 6 months.  She denies fevers, chills, or cough.  History reviewed. No pertinent past medical history.  History reviewed. No pertinent surgical history.  History reviewed. No pertinent family history.  Social History:  reports that she has been smoking cigarettes. She has been smoking about 0.50 packs per day. She has never used smokeless tobacco. She reports previous drug use. She reports that she does not drink alcohol.  Allergies: No Known Allergies  Medications: Betty have reviewed the patient's current medications.  Results for orders placed or performed during the hospital encounter of 03/01/19 (from the past 48 hour(s))  Urinalysis, Routine w reflex microscopic     Status: Abnormal   Collection Time: 03/01/19  3:59 AM  Result Value Ref Range   Color, Urine AMBER (A) YELLOW    Comment: BIOCHEMICALS MAY BE AFFECTED BY COLOR   APPearance HAZY (A) CLEAR   Specific Gravity, Urine 1.024 1.005 - 1.030   pH 6.0 5.0 - 8.0   Glucose, UA NEGATIVE NEGATIVE mg/dL   Hgb urine dipstick LARGE (A) NEGATIVE   Bilirubin Urine MODERATE (A) NEGATIVE   Ketones, ur NEGATIVE NEGATIVE mg/dL   Protein, ur 409 (A) NEGATIVE mg/dL   Nitrite NEGATIVE NEGATIVE   Leukocytes,Ua NEGATIVE NEGATIVE   RBC / HPF >50 (H) 0 - 5 RBC/hpf   WBC, UA 6-10 0 - 5 WBC/hpf   Bacteria, UA FEW (A) NONE  SEEN   Squamous Epithelial / LPF 11-20 0 - 5   Mucus PRESENT     Comment: Performed at [facility], [address], [place], [place] [address]  CBC with Differential/Platelet     Status: None   Collection Time: 03/01/19  4:23 AM  Result Value Ref Range   WBC 9.4 4.0 - 10.5 K/uL   RBC 4.77 3.87 - 5.11 MIL/uL   Hemoglobin 13.7 12.0 - 15.0 g/dL   HCT 47.8 29.5 - 62.1 %   MCV 88.9 80.0 - 100.0 fL   MCH 28.7 26.0 - 34.0 pg   MCHC 32.3 30.0 - 36.0 g/dL   RDW 30.8 65.7 - 84.6 %   Platelets 348 150 - 400 K/uL   nRBC 0.0 0.0 - 0.2 %   Neutrophils Relative % 68 %   Neutro Abs 6.5 1.7 - 7.7 K/uL   Lymphocytes Relative 19 %   Lymphs Abs 1.7 0.7 - 4.0 K/uL   Monocytes Relative 10 %   Monocytes Absolute 1.0 0.1 - 1.0 K/uL   Eosinophils Relative 1 %   Eosinophils Absolute 0.1 0.0 - 0.5 K/uL   Basophils Relative 1 %   Basophils Absolute 0.1 0.0 - 0.1 K/uL   Immature Granulocytes 1 %   Abs Immature Granulocytes 0.07 0.00 - 0.07 K/uL    Comment: Performed at [facility], [address] [place], [place] [address]  Lipase, blood     Status: Abnormal   Collection Time: 03/01/19  4:58 AM  Result Value Ref Range   Lipase 59 (H) 11 - 51 U/L    Comment: Performed at [facility], [address], [place], [place] [address]  Comprehensive metabolic panel     Status: Abnormal   Collection Time: 03/01/19  4:58 AM  Result Value Ref Range   Sodium 138 135 - 145 mmol/L   Potassium 3.7 3.5 - 5.1 mmol/L   Chloride 106 98 - 111 mmol/L   CO2 24 22 - 32 mmol/L   Glucose, Bld 111 (H) 70 - 99 mg/dL   BUN 13 6 - 20 mg/dL   Creatinine, Ser 7.82 0.44 - 1.00 mg/dL   Calcium 9.4 8.9 - 95.6 mg/dL   Total Protein 7.5 6.5 - 8.1 g/dL   Albumin 4.1 3.5 - 5.0 g/dL   AST 213 (H) 15 - 41 U/L   ALT 741 (H) 0 - 44 U/L   Alkaline Phosphatase 134 (H) 38 - 126 U/L   Total Bilirubin 4.6 (H) 0.3 - 1.2 mg/dL   GFR calc non Af Amer >60 >60 mL/min   GFR calc Af Amer  >60 >60 mL/min   Anion gap 8 5 - 15    Comment: Performed at [facility], [address], [place], [place] [address]  Betty-Stat beta hCG blood, ED     Status: None   Collection Time: 03/01/19  5:05 AM  Result Value Ref Range   Betty-stat hCG, quantitative <5.0 <5 mIU/mL   Comment 3            Comment:   GEST. AGE      CONC.  (mIU/mL)   <=1 WEEK        5 - 50     2 WEEKS       50 - 500     3 WEEKS       100 - 10,000     4 WEEKS     1,000 - 30,000        FEMALE AND NON-PREGNANT FEMALE:     LESS THAN 5 mIU/mL   Hepatitis panel, acute     Status: None   Collection Time: 03/01/19  9:31 AM  Result Value Ref Range   Hepatitis B Surface Ag Negative Negative   HCV Ab <0.1 0.0 - 0.9 s/co ratio    Comment: (NOTE)                                  Negative:     < 0.8                             Indeterminate: 0.8 - 0.9                                  Positive:     > 0.9 The CDC recommends that a positive HCV antibody result be followed up with a HCV Nucleic Acid Amplification test (846962). Performed At: [facility] [address] [place], [place] [id number] Jolene Schimke MD [id number]    Hep A IgM Negative Negative   Hep B C IgM Negative Negative  Basic metabolic panel     Status: Abnormal  Collection Time: 03/02/19  3:30 AM  Result Value Ref Range   Sodium 139 135 - 145 mmol/L   Potassium 3.8 3.5 - 5.1 mmol/L   Chloride 108 98 - 111 mmol/L   CO2 23 22 - 32 mmol/L   Glucose, Bld 93 70 - 99 mg/dL   BUN 14 6 - 20 mg/dL   Creatinine, Ser 9.35 0.44 - 1.00 mg/dL   Calcium 8.7 (L) 8.9 - 10.3 mg/dL   GFR calc non Af Amer >60 >60 mL/min   GFR calc Af Amer >60 >60 mL/min   Anion gap 8 5 - 15    Comment: Performed at [facility], [address], [place], [place] [address]  CBC     Status: Abnormal   Collection Time: 03/02/19  3:30 AM  Result Value Ref Range   WBC 6.6 4.0 - 10.5 K/uL   RBC 4.03 3.87 - 5.11 MIL/uL   Hemoglobin 11.6 (L) 12.0  - 15.0 g/dL   HCT 93.9 03.0 - 09.2 %   MCV 90.1 80.0 - 100.0 fL   MCH 28.8 26.0 - 34.0 pg   MCHC 32.0 30.0 - 36.0 g/dL   RDW 33.0 07.6 - 22.6 %   Platelets 268 150 - 400 K/uL   nRBC 0.0 0.0 - 0.2 %    Comment: Performed at [facility], [address], [place], [place] [address]  Hepatic function panel     Status: Abnormal   Collection Time: 03/02/19  3:30 AM  Result Value Ref Range   Total Protein 5.9 (L) 6.5 - 8.1 g/dL   Albumin 3.3 (L) 3.5 - 5.0 g/dL   AST 562 (H) 15 - 41 U/L   ALT 426 (H) 0 - 44 U/L   Alkaline Phosphatase 124 38 - 126 U/L   Total Bilirubin 4.2 (H) 0.3 - 1.2 mg/dL   Bilirubin, Direct 2.7 (H) 0.0 - 0.2 mg/dL   Indirect Bilirubin 1.5 (H) 0.3 - 0.9 mg/dL    Comment: Performed at [facility], [address], [place], [place] [address]    US Abdomen Limited  Result Date: 03/01/2019 CLINICAL DATA:  Right upper quadrant pain. EXAM: ULTRASOUND ABDOMEN LIMITED RIGHT UPPER QUADRANT COMPARISON:  None. FINDINGS: Gallbladder: There is a 5 mm stone in the gallbladder. There is also mild sludge. No wall thickening, pericholecystic fluid, or reported Murphy's sign. Common bile duct: Diameter: 9 mm Liver: No focal mass. Mild intrahepatic ductal dilatation. Portal vein is patent on color Doppler imaging with normal direction of blood flow towards the liver. IMPRESSION: 1. Intra and extrahepatic biliary ductal dilatation. The finding is concerning for biliary duct obstruction. Recommend correlation with labs. An MRCP or ERCP could better evaluate. 2. There is a 5 mm stone in the gallbladder in addition to a small amount of sludge without wall thickening, pericholecystic fluid, or Murphy's sign. Electronically Signed   By: Gerome Sam III M.D   On: 03/01/2019 05:06   Mr Abdomen With Mrcp W Contrast  Result Date: 03/02/2019 CLINICAL DATA:  33 year old female with elevated liver function tests complaining of mid chest pain or and upper abdominal  pain. Nausea and vomiting. IV drug abuse. EXAM: MRI ABDOMEN WITH CONTRAST (WITH MRCP) TECHNIQUE: Multiplanar multisequence MR imaging of the abdomen was performed following the administration of intravenous contrast. Heavily T2-weighted images of the biliary and pancreatic ducts were obtained, and three-dimensional MRCP images were rendered by post processing. CONTRAST:  7 mL of Gadavist. COMPARISON:  No  prior abdominal MRI. Abdominal ultrasound 03/01/2019. FINDINGS: Lower chest: Unremarkable. Hepatobiliary: No suspicious cystic or solid hepatic lesions. No intra or extrahepatic biliary ductal dilatation noted on MRCP images. Common bile duct measures 4 mm in the porta hepatis. No definite filling defect in the common bile duct to suggest choledocholithiasis. Gallbladder is nearly completely decompressed, but otherwise unremarkable in appearance. Previously noted gallstone is not confidently identified on today's examination. Pancreas: No pancreatic mass. No pancreatic ductal dilatation noted on MRCP images. No pancreatic or peripancreatic fluid or inflammatory changes. Spleen:  Unremarkable. Adrenals/Urinary Tract: Bilateral kidneys and bilateral adrenal glands are normal in appearance. No hydroureteronephrosis is in the visualized portions of the abdomen. Stomach/Bowel: Visualized portions are unremarkable. Vascular/Lymphatic: No aneurysm identified in the visualized abdominal vasculature. No lymphadenopathy noted in the abdomen. Other: No significant volume of ascites noted in the visualized portions of the peritoneal cavity. Musculoskeletal: No aggressive appearing osseous lesions are noted in the visualized portions of the skeleton. IMPRESSION: 1. No choledocholithiasis or evidence of biliary tract obstruction. 2. No acute findings in the abdomen. Electronically Signed   By: Trudie Reedaniel  Entrikin M.D.   On: 03/02/2019 10:22    Review of Systems  Constitutional: Negative for chills and fever.  Respiratory:  Negative for cough and shortness of breath.   Cardiovascular: Negative for chest pain.  Gastrointestinal: Positive for abdominal pain and nausea. Negative for vomiting.  Genitourinary: Negative for dysuria.  All other systems reviewed and are negative.  Blood pressure 111/78, pulse (!) 59, temperature 98.4 F (36.9 C), temperature source Oral, resp. rate 16, height 5\' 4"  (1.626 m), weight 63.5 kg, last menstrual period 01/22/2019, SpO2 100 %. Physical Exam  Constitutional: She is oriented to person, place, and time. She appears well-developed and well-nourished. No distress.  HENT:  Head: Normocephalic and atraumatic.  Right Ear: External ear normal.  Left Ear: External ear normal.  Nose: Nose normal.  Mouth/Throat: Oropharynx is clear and moist.  Eyes: Pupils are equal, round, and reactive to light. Right eye exhibits no discharge. Left eye exhibits no discharge.  Very slight scleral icterus  Neck: Normal range of motion. No tracheal deviation present. No thyromegaly present.  Cardiovascular: Normal rate, regular rhythm, normal heart sounds and intact distal pulses.  No murmur heard. Respiratory: Effort normal and breath sounds normal. No respiratory distress.  GI: Soft. She exhibits no distension. There is no abdominal tenderness. There is no rebound and no guarding.  Musculoskeletal: Normal range of motion.        General: No tenderness, deformity or edema.  Neurological: She is alert and oriented to person, place, and time.  Skin: Skin is warm and dry. She is not diaphoretic. No erythema. No pallor.  Psychiatric: Her behavior is normal. Judgment normal.    Assessment/Plan: Symptomatic cholelithiasis with elevated LFTs  Betty discussed the diagnosis with her in detail.  Discussed this with GI as well.  The MRCP is negative for a common bile duct stone.  The plan would be to most likely proceed to the operating room tomorrow for a laparoscopic cholecystectomy with cholangiogram.  Betty  discussed this with her in detail.  Betty discussed that a stone may still be present in the bile duct, and if this was present, an ERCP may still be necessary.  Betty discussed the surgical procedure in detail including the risk.  She will be n.p.o. at midnight for a possible cholecystectomy by Dr. Michaell CowingGross tomorrow who is our acute care surgeon this week.  Betty Carter 03/02/2019, 4:32  PM

## 2019-03-03 ENCOUNTER — Inpatient Hospital Stay (HOSPITAL_COMMUNITY): Payer: Self-pay | Admitting: Anesthesiology

## 2019-03-03 ENCOUNTER — Encounter (HOSPITAL_COMMUNITY)
Admission: EM | Disposition: A | Discharge status: Home / Self Care | Payer: Self-pay | Source: Home / Self Care | Attending: Internal Medicine

## 2019-03-03 ENCOUNTER — Encounter (HOSPITAL_COMMUNITY): Payer: Self-pay | Admitting: Surgery

## 2019-03-03 DIAGNOSIS — R933 Abnormal findings on diagnostic imaging of other parts of digestive tract: Secondary | ICD-10-CM

## 2019-03-03 DIAGNOSIS — K812 Acute cholecystitis with chronic cholecystitis: Secondary | ICD-10-CM

## 2019-03-03 DIAGNOSIS — F329 Major depressive disorder, single episode, unspecified: Secondary | ICD-10-CM

## 2019-03-03 DIAGNOSIS — F419 Anxiety disorder, unspecified: Secondary | ICD-10-CM

## 2019-03-03 LAB — COMPREHENSIVE METABOLIC PANEL
ALT: 295 U/L — ABNORMAL HIGH (ref 0–44)
AST: 95 U/L — ABNORMAL HIGH (ref 15–41)
Albumin: 3.4 g/dL — ABNORMAL LOW (ref 3.5–5.0)
Alkaline Phosphatase: 120 U/L (ref 38–126)
Anion gap: 8 (ref 5–15)
BUN: 12 mg/dL (ref 6–20)
CO2: 22 mmol/L (ref 22–32)
Calcium: 8.7 mg/dL — ABNORMAL LOW (ref 8.9–10.3)
Chloride: 109 mmol/L (ref 98–111)
Creatinine, Ser: 0.72 mg/dL (ref 0.44–1.00)
GFR calc Af Amer: >60 mL/min (ref >60)
GFR calc non Af Amer: >60 mL/min (ref >60)
Glucose, Bld: 92 mg/dL (ref 70–99)
Potassium: 4.1 mmol/L (ref 3.5–5.1)
Sodium: 139 mmol/L (ref 135–145)
Total Bilirubin: 2.6 mg/dL — ABNORMAL HIGH (ref 0.3–1.2)
Total Protein: 6.1 g/dL — ABNORMAL LOW (ref 6.5–8.1)

## 2019-03-03 LAB — CBC
HCT: 37.9 % (ref 36.0–46.0)
Hemoglobin: 11.7 g/dL — ABNORMAL LOW (ref 12.0–15.0)
MCH: 28.2 pg (ref 26.0–34.0)
MCHC: 30.9 g/dL (ref 30.0–36.0)
MCV: 91.3 fL (ref 80.0–100.0)
Platelets: 279 10*3/uL (ref 150–400)
RBC: 4.15 MIL/uL (ref 3.87–5.11)
RDW: 12.2 % (ref 11.5–15.5)
WBC: 6.9 10*3/uL (ref 4.0–10.5)
nRBC: 0 % (ref 0.0–0.2)

## 2019-03-03 LAB — HIV ANTIBODY (ROUTINE TESTING W REFLEX): HIV Screen 4th Generation wRfx: NONREACTIVE

## 2019-03-03 SURGERY — LAPAROSCOPIC CHOLECYSTECTOMY SINGLE SITE
Anesthesia: Choice

## 2019-03-03 MED ORDER — LIDOCAINE 2% (20 MG/ML) 5 ML SYRINGE
INTRAMUSCULAR | Status: AC
  Filled 2019-03-03: qty 5

## 2019-03-03 MED ORDER — FENTANYL CITRATE (PF) 250 MCG/5ML IJ SOLN
INTRAMUSCULAR | Status: AC
  Filled 2019-03-03: qty 5

## 2019-03-03 MED ORDER — ONDANSETRON 4 MG PO TBDP: 4.0000 mg | ORAL_TABLET | Freq: Three times a day (TID) | ORAL | 0 refills | Status: DC | PRN

## 2019-03-03 MED ORDER — MIDAZOLAM HCL 2 MG/2ML IJ SOLN
INTRAMUSCULAR | Status: AC
  Filled 2019-03-03: qty 2

## 2019-03-03 MED ORDER — SUGAMMADEX SODIUM 200 MG/2ML IV SOLN
INTRAVENOUS | Status: AC
  Filled 2019-03-03: qty 2

## 2019-03-03 MED ORDER — ONDANSETRON HCL 4 MG/2ML IJ SOLN
INTRAMUSCULAR | Status: AC
  Filled 2019-03-03: qty 2

## 2019-03-03 MED ORDER — PROPOFOL 10 MG/ML IV BOLUS
INTRAVENOUS | Status: AC
  Filled 2019-03-03: qty 40

## 2019-03-03 MED ORDER — ROCURONIUM BROMIDE 10 MG/ML (PF) SYRINGE
PREFILLED_SYRINGE | INTRAVENOUS | Status: AC
  Filled 2019-03-03: qty 10

## 2019-03-03 MED ORDER — SUCCINYLCHOLINE CHLORIDE 200 MG/10ML IV SOSY
PREFILLED_SYRINGE | INTRAVENOUS | Status: AC
  Filled 2019-03-03: qty 10

## 2019-03-03 MED ORDER — BUPIVACAINE LIPOSOME 1.3 % IJ SUSP
20.0000 mL | INTRAMUSCULAR | Status: DC
  Filled 2019-03-03: qty 20

## 2019-03-03 MED ORDER — DEXAMETHASONE SODIUM PHOSPHATE 10 MG/ML IJ SOLN
INTRAMUSCULAR | Status: AC
  Filled 2019-03-03: qty 1

## 2019-03-03 MED ORDER — NICOTINE 21 MG/24HR TD PT24
21.0000 mg | MEDICATED_PATCH | Freq: Every day | TRANSDERMAL | Status: DC
  Filled 2019-03-03: qty 1

## 2019-03-03 SURGICAL SUPPLY — 44 items
APL PRP STRL LF DISP 70% ISPRP (MISCELLANEOUS) ×1
APPLIER CLIP 5 13 M/L LIGAMAX5 (MISCELLANEOUS) ×3
APR CLP MED LRG 5 ANG JAW (MISCELLANEOUS) ×1
BAG SPEC RTRVL 10 TROC 200 (ENDOMECHANICALS)
CABLE HIGH FREQUENCY MONO STRZ (ELECTRODE) ×4 IMPLANT
CHLORAPREP W/TINT 26 (MISCELLANEOUS) ×4 IMPLANT
CLIP APPLIE 5 13 M/L LIGAMAX5 (MISCELLANEOUS) ×2 IMPLANT
COVER MAYO STAND STRL (DRAPES) ×4 IMPLANT
COVER SURGICAL LIGHT HANDLE (MISCELLANEOUS) ×4 IMPLANT
COVER WAND RF STERILE (DRAPES) IMPLANT
DECANTER SPIKE VIAL GLASS SM (MISCELLANEOUS) ×4 IMPLANT
DRAIN CHANNEL 19F RND (DRAIN) IMPLANT
DRAPE C-ARM 42X120 X-RAY (DRAPES) ×4 IMPLANT
DRAPE WARM FLUID 44X44 (DRAPE) ×4 IMPLANT
DRSG TEGADERM 4X4.75 (GAUZE/BANDAGES/DRESSINGS) ×4 IMPLANT
ELECT REM PT RETURN 15FT ADLT (MISCELLANEOUS) ×4 IMPLANT
ENDOLOOP SUT PDS II  0 18 (SUTURE)
ENDOLOOP SUT PDS II 0 18 (SUTURE) IMPLANT
EVACUATOR SILICONE 100CC (DRAIN) IMPLANT
GAUZE SPONGE 2X2 8PLY STRL LF (GAUZE/BANDAGES/DRESSINGS) ×2 IMPLANT
GLOVE ECLIPSE 8.0 STRL XLNG CF (GLOVE) ×4 IMPLANT
GLOVE INDICATOR 8.0 STRL GRN (GLOVE) ×4 IMPLANT
GOWN STRL REUS W/TWL XL LVL3 (GOWN DISPOSABLE) ×8 IMPLANT
IRRIG SUCT STRYKERFLOW 2 WTIP (MISCELLANEOUS) ×3
IRRIGATION SUCT STRKRFLW 2 WTP (MISCELLANEOUS) ×2 IMPLANT
KIT BASIN OR (CUSTOM PROCEDURE TRAY) ×4 IMPLANT
KIT TURNOVER KIT A (KITS) IMPLANT
PAD POSITIONING PINK XL (MISCELLANEOUS) ×4 IMPLANT
POUCH RETRIEVAL ECOSAC 10 (ENDOMECHANICALS) IMPLANT
POUCH RETRIEVAL ECOSAC 10MM (ENDOMECHANICALS)
PROTECTOR NERVE ULNAR (MISCELLANEOUS) IMPLANT
SCISSORS LAP 5X35 DISP (ENDOMECHANICALS) ×4 IMPLANT
SET CHOLANGIOGRAPH MIX (MISCELLANEOUS) ×4 IMPLANT
SET TUBE SMOKE EVAC HIGH FLOW (TUBING) ×4 IMPLANT
SHEARS HARMONIC ACE PLUS 36CM (ENDOMECHANICALS) ×4 IMPLANT
SPONGE GAUZE 2X2 STER 10/PKG (GAUZE/BANDAGES/DRESSINGS) ×2
SUT MNCRL AB 4-0 PS2 18 (SUTURE) ×4 IMPLANT
SUT PDS AB 1 CT1 27 (SUTURE) ×8 IMPLANT
SYR 20CC LL (SYRINGE) ×4 IMPLANT
TOWEL OR 17X26 10 PK STRL BLUE (TOWEL DISPOSABLE) ×4 IMPLANT
TOWEL OR NON WOVEN STRL DISP B (DISPOSABLE) ×4 IMPLANT
TRAY LAPAROSCOPIC (CUSTOM PROCEDURE TRAY) ×4 IMPLANT
TROCAR BLADELESS OPT 5 100 (ENDOMECHANICALS) ×4 IMPLANT
TROCAR BLADELESS OPT 5 150 (ENDOMECHANICALS) ×4 IMPLANT

## 2019-03-03 NOTE — Progress Notes (Addendum)
Betty Carter [id number] [birth year]  CARE TEAM:  PCP: Patient, No Pcp Per  Outpatient Care Team: Patient Care Team: Patient, No Pcp Per as PCP - General (General Practice)  Inpatient Treatment Team: Treatment Team: Attending Provider: Cathren Harsh, MD; Registered Nurse: Gala Lewandowsky, RN; Consulting Physician: Bernette Redbird, MD; Technician: Terance Hart, [place]; Rounding Team: Lilyan Gilford, MD; Consulting Physician: Montez Morita, Md, MD; Consulting Physician: Carman Ching, MD   Problem List:   Principal Problem:   Choledocholithiasis Active Problems:   History of drug abuse (HCC)   Transaminitis   Anxiety and depression   Acute on chronic cholecystitis   Day of Surgery   Assessment  Abdominal pain with transient pneumonitis.  Pain and LFTs improved.  MRCP shows no persistent common bile duct stone.  [facility] Stay = 1 days)  Plan:  I think she would benefit from cholecystectomy since she had sludge in a stone on the initial ultrasound.  Likelihood of recurrent attacks and episode of choledocholithiasis/pancreatitis imminent.  Patient was interested in proceeding last night on initial consultation but then hesitated.  I had a long discussion with the patient.  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  I noted while there are risks, right now her pain is less, her numbers have normalized, she does not seem malnourished.  We can minimize her risk by doing this laparoscopically.  Hopefully avoid the need for drains or follow-up ERCP if cholangiogram is negative.  Do enhance recovery pathway to minimize any narcotic use.  Likelihood of recurrent tacks high.  Difficult to fit this and electively during the COVID pandemic.  Nonoperative alternatives have not been successful, hence the need for surgery for this indication.  She relents and agrees with proceeding with surgery to minimize risk  now.  And to try and do this morning.  D/w Dr Isidoro Donning with primary service.  The anatomy & physiology of hepatobiliary & pancreatic function was discussed.  The pathophysiology of gallbladder dysfunction was discussed.  Natural history risks without surgery was discussed.   I feel the risks of no intervention will lead to serious problems that outweigh the operative risks; therefore, I recommended cholecystectomy to remove the pathology.  I explained laparoscopic techniques with possible need for an open approach.  Probable cholangiogram to evaluate the bilary tract was explained as well.    Risks such as bleeding, infection, abscess, leak, injury to other organs, need for repair of tissues / organs, need for further treatment, stroke, heart attack, death, and other risks were discussed.  I noted a good likelihood this will help address the problem.  Possibility that this will not correct all abdominal symptoms was explained.  Goals of post-operative recovery were discussed as well.  We will work to minimize complications.  An educational handout further explaining the pathology and treatment options was given as well.  Questions were answered.  The patient expresses understanding & wishes to proceed with surgery.  I updated the patient's status to the patient and nurse.  Recommendations were made.  Questions were answered.  They expressed understanding & appreciation.    -History of IV drug abuse.  Heroin.  Abstinent for approximately 6 months.  She would like to avoid that if possible.  Noted if we can do this today on the enhanced recovery protocol, we can try and minimize that.  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  Most likely related to  medication levamisole.  Seen by [facility] in the past.  Pain management with gabapentin.  Completed steroid taper June 2019.  Not on any immunosuppressive.  I think her risk of leak and other issues are not increased with this condition in my mind & I  tried to reassure of that as well.    -VTE prophylaxis- SCDs, etc -mobilize as tolerated to help recovery  25 minutes spent in review, evaluation, examination, counseling, and coordination of care.  More than 50% of that time was spent in counseling.  03/03/2019    Subjective: (Chief complaint)  Denies much pain.  Dr. Isidoro Donning with primary service in room.  Patient hesitant to proceed with surgery today.  Was wondering if she could delay.  Objective:  Vital signs:  Vitals:   03/02/19 0532 03/02/19 1448 03/02/19 2058 03/03/19 0553  BP: 101/62 111/78 114/64 119/81  Pulse: (!) 57 (!) 59 63 64  Resp: Temp: 98.3 F (36.8 C) 98.4 F (36.9 C) 97.8 F (36.6 C) 97.8 F (36.6 C)  TempSrc: Oral Oral Oral Oral  SpO2: 98% 100% 98% 99%  Weight:      Height:        Last BM Date: 02/27/19  Intake/Output   Yesterday:  No intake/output data recorded. This shift:  No intake/output data recorded.  Bowel function:  Flatus: YES  BM:  No  Drain: (No drain)   Physical Exam:  General: Pt awake/alert/oriented x4 in no acute distress Eyes: PERRL, normal EOM.  Sclera clear.  No icterus Neuro: CN II-XII intact w/o focal sensory/motor deficits. Lymph: No head/neck/groin lymphadenopathy Psych:  No delerium/psychosis/paranoia HENT: Normocephalic, Mucus membranes moist.  No thrush Neck: Supple, No tracheal deviation Chest: No chest wall pain w good excursion CV:  Pulses intact.  Regular rhythm MS: Normal AROM mjr joints.  No obvious deformity  Abdomen: Soft.  Nondistended.  Tenderness at RUQ mild.  No evidence of peritonitis.  No incarcerated hernias.  Ext:   No deformity.  No mjr edema.  No cyanosis Skin: No petechiae / purpura  Results:   Labs: Results for orders placed or performed during the hospital encounter of 03/01/19 (from the past 48 hour(s))  Hepatitis panel, acute     Status: None   Collection Time: 03/01/19  9:31 AM  Result Value Ref Range    Hepatitis B Surface Ag Negative Negative   HCV Ab <0.1 0.0 - 0.9 s/co ratio    Comment: (NOTE)                                  Negative:     < 0.8                             Indeterminate: 0.8 - 0.9                                  Positive:     > 0.9 The CDC recommends that a positive HCV antibody result be followed up with a HCV Nucleic Acid Amplification test (431540). Performed At: [facility] [address] [place], [place] [id number] Jolene Schimke MD [id number]    Hep A IgM Negative Negative   Hep B C IgM Negative Negative  Basic metabolic panel     Status: Abnormal  Collection Time: 03/02/19  3:30 AM  Result Value Ref Range   Sodium 139 135 - 145 mmol/L   Potassium 3.8 3.5 - 5.1 mmol/L   Chloride 108 98 - 111 mmol/L   CO2 23 22 - 32 mmol/L   Glucose, Bld 93 70 - 99 mg/dL   BUN 14 6 - 20 mg/dL   Creatinine, Ser 3.71 0.44 - 1.00 mg/dL   Calcium 8.7 (L) 8.9 - 10.3 mg/dL   GFR calc non Af Amer >60 >60 mL/min   GFR calc Af Amer >60 >60 mL/min   Anion gap 8 5 - 15    Comment: Performed at [facility], [address] [place], [place] [address]  CBC     Status: Abnormal   Collection Time: 03/02/19  3:30 AM  Result Value Ref Range   WBC 6.6 4.0 - 10.5 K/uL   RBC 4.03 3.87 - 5.11 MIL/uL   Hemoglobin 11.6 (L) 12.0 - 15.0 g/dL   HCT 93.8 10.1 - 75.1 %   MCV 90.1 80.0 - 100.0 fL   MCH 28.8 26.0 - 34.0 pg   MCHC 32.0 30.0 - 36.0 g/dL   RDW 02.5 85.2 - 77.8 %   Platelets 268 150 - 400 K/uL   nRBC 0.0 0.0 - 0.2 %    Comment: Performed at [facility], [address], [place], [place] [address]  Hepatic function panel     Status: Abnormal   Collection Time: 03/02/19  3:30 AM  Result Value Ref Range   Total Protein 5.9 (L) 6.5 - 8.1 g/dL   Albumin 3.3 (L) 3.5 - 5.0 g/dL   AST 361 (H) 15 - 41 U/L   ALT 426 (H) 0 - 44 U/L   Alkaline Phosphatase 124 38 - 126 U/L   Total Bilirubin 4.2 (H) 0.3 - 1.2 mg/dL   Bilirubin, Direct  2.7 (H) 0.0 - 0.2 mg/dL   Indirect Bilirubin 1.5 (H) 0.3 - 0.9 mg/dL    Comment: Performed at [facility] [address] [place], [place] [address]  CBC     Status: Abnormal   Collection Time: 03/03/19  4:01 AM  Result Value Ref Range   WBC 6.9 4.0 - 10.5 K/uL   RBC 4.15 3.87 - 5.11 MIL/uL   Hemoglobin 11.7 (L) 12.0 - 15.0 g/dL   HCT 40.0 86.7 - 61.9 %   MCV 91.3 80.0 - 100.0 fL   MCH 28.2 26.0 - 34.0 pg   MCHC 30.9 30.0 - 36.0 g/dL   RDW 50.9 32.6 - 71.2 %   Platelets 279 150 - 400 K/uL   nRBC 0.0 0.0 - 0.2 %    Comment: Performed at [facility], [address], [place], [place] [address]  Comprehensive metabolic panel     Status: Abnormal   Collection Time: 03/03/19  4:01 AM  Result Value Ref Range   Sodium 139 135 - 145 mmol/L   Potassium 4.1 3.5 - 5.1 mmol/L   Chloride 109 98 - 111 mmol/L   CO2 22 22 - 32 mmol/L   Glucose, Bld 92 70 - 99 mg/dL   BUN 12 6 - 20 mg/dL   Creatinine, Ser 9.83 0.44 - 1.00 mg/dL   Calcium 8.7 (L) 8.9 - 10.3 mg/dL   Total Protein 6.1 (L) 6.5 - 8.1 g/dL   Albumin 3.4 (L) 3.5 - 5.0 g/dL   AST 95 (H) 15 - 41 U/L   ALT 295 (H)  0 - 44 U/L   Alkaline Phosphatase 120 38 - 126 U/L   Total Bilirubin 2.6 (H) 0.3 - 1.2 mg/dL   GFR calc non Af Amer >60 >60 mL/min   GFR calc Af Amer >60 >60 mL/min   Anion gap 8 5 - 15    Comment: Performed at [facility], [address] [place], [place] [id number]    Imaging / Studies: Mr 3d Recon At Scanner  Result Date: 03/03/2019 CLINICAL DATA:  33 year old female with elevated liver function tests complaining of mid chest pain or and upper abdominal pain. Nausea and vomiting. IV drug abuse. EXAM: MRI ABDOMEN WITH CONTRAST (WITH MRCP) TECHNIQUE: Multiplanar multisequence MR imaging of the abdomen was performed following the administration of intravenous contrast. Heavily T2-weighted images of the biliary and pancreatic ducts were obtained, and  three-dimensional MRCP images were rendered by post processing. CONTRAST:  7 mL of Gadavist. COMPARISON:  No prior abdominal MRI. Abdominal ultrasound 03/01/2019. FINDINGS: Lower chest: Unremarkable. Hepatobiliary: No suspicious cystic or solid hepatic lesions. No intra or extrahepatic biliary ductal dilatation noted on MRCP images. Common bile duct measures 4 mm in the porta hepatis. No definite filling defect in the common bile duct to suggest choledocholithiasis. Gallbladder is nearly completely decompressed, but otherwise unremarkable in appearance. Previously noted gallstone is not confidently identified on today's examination. Pancreas: No pancreatic mass. No pancreatic ductal dilatation noted on MRCP images. No pancreatic or peripancreatic fluid or inflammatory changes. Spleen:  Unremarkable. Adrenals/Urinary Tract: Bilateral kidneys and bilateral adrenal glands are normal in appearance. No hydroureteronephrosis is in the visualized portions of the abdomen. Stomach/Bowel: Visualized portions are unremarkable. Vascular/Lymphatic: No aneurysm identified in the visualized abdominal vasculature. No lymphadenopathy noted in the abdomen. Other: No significant volume of ascites noted in the visualized portions of the peritoneal cavity. Musculoskeletal: No aggressive appearing osseous lesions are noted in the visualized portions of the skeleton. IMPRESSION: 1. No choledocholithiasis or evidence of biliary tract obstruction. 2. No acute findings in the abdomen. Electronically Signed   By: Trudie Reedaniel  Entrikin M.D.   On: 03/02/2019 10:22   Mr Abdomen With Mrcp W Contrast  Result Date: 03/02/2019 CLINICAL DATA:  33 year old female with elevated liver function tests complaining of mid chest pain or and upper abdominal pain. Nausea and vomiting. IV drug abuse. EXAM: MRI ABDOMEN WITH CONTRAST (WITH MRCP) TECHNIQUE: Multiplanar multisequence MR imaging of the abdomen was performed following the administration of intravenous  contrast. Heavily T2-weighted images of the biliary and pancreatic ducts were obtained, and three-dimensional MRCP images were rendered by post processing. CONTRAST:  7 mL of Gadavist. COMPARISON:  No prior abdominal MRI. Abdominal ultrasound 03/01/2019. FINDINGS: Lower chest: Unremarkable. Hepatobiliary: No suspicious cystic or solid hepatic lesions. No intra or extrahepatic biliary ductal dilatation noted on MRCP images. Common bile duct measures 4 mm in the porta hepatis. No definite filling defect in the common bile duct to suggest choledocholithiasis. Gallbladder is nearly completely decompressed, but otherwise unremarkable in appearance. Previously noted gallstone is not confidently identified on today's examination. Pancreas: No pancreatic mass. No pancreatic ductal dilatation noted on MRCP images. No pancreatic or peripancreatic fluid or inflammatory changes. Spleen:  Unremarkable. Adrenals/Urinary Tract: Bilateral kidneys and bilateral adrenal glands are normal in appearance. No hydroureteronephrosis is in the visualized portions of the abdomen. Stomach/Bowel: Visualized portions are unremarkable. Vascular/Lymphatic: No aneurysm identified in the visualized abdominal vasculature. No lymphadenopathy noted in the abdomen. Other: No significant volume of ascites noted in the visualized portions of the peritoneal cavity.  Musculoskeletal: No aggressive appearing osseous lesions are noted in the visualized portions of the skeleton. IMPRESSION: 1. No choledocholithiasis or evidence of biliary tract obstruction. 2. No acute findings in the abdomen. Electronically Signed   By: Trudie Reed M.D.   On: 03/02/2019 10:22    Medications / Allergies: per chart  Antibiotics: Anti-infectives (From admission, onward)   Start     Dose/Rate Route Frequency Ordered Stop   03/03/19 0600  ceFAZolin (ANCEF) IVPB 2g/100 mL premix     2 g 200 mL/hr over 30 Minutes Intravenous On call to O.R. 03/02/19 1641 03/04/19 0559         Note: Portions of this report may have been transcribed using voice recognition software. Every effort was made to ensure accuracy; however, inadvertent computerized transcription errors may be present.   Any transcriptional errors that result from this process are unintentional.     Ardeth Sportsman, MD, FACS, MASCRS Gastrointestinal and Minimally Invasive Surgery    [address], [address] [place], [place] [address] [phone] Main / Paging [phone] Fax

## 2019-03-03 NOTE — Anesthesia Preprocedure Evaluation (Deleted)
Anesthesia Evaluation  Patient identified by MRN, date of birth, ID band Patient awake    Reviewed: Allergy & Precautions, NPO status , Patient's Chart, lab work & pertinent test results  History of Anesthesia Complications Negative for: history of anesthetic complications  Airway Mallampati: II  TM Distance: >3 FB Neck ROM: Full    Dental  (+) Teeth Intact, Dental Advisory Given   Pulmonary Current Smoker,    breath sounds clear to auscultation       Cardiovascular negative cardio ROS   Rhythm:Regular     Neuro/Psych PSYCHIATRIC DISORDERS Anxiety Depression negative neurological ROS     GI/Hepatic Neg liver ROS, cholecystitis   Endo/Other  negative endocrine ROS  Renal/GU negative Renal ROS     Musculoskeletal   Abdominal   Peds  Hematology  (+) anemia ,   Anesthesia Other Findings   Reproductive/Obstetrics                             Anesthesia Physical Anesthesia Plan  ASA: II  Anesthesia Plan: General   Post-op Pain Management:    Induction: Intravenous and Rapid sequence  PONV Risk Score and Plan: 2 and Ondansetron and Dexamethasone  Airway Management Planned: Oral ETT  Additional Equipment: None  Intra-op Plan:   Post-operative Plan: Extubation in OR  Informed Consent: I have reviewed the patients History and Physical, chart, labs and discussed the procedure including the risks, benefits and alternatives for the proposed anesthesia with the patient or authorized representative who has indicated his/her understanding and acceptance.     Dental advisory given  Plan Discussed with: CRNA and Surgeon  Anesthesia Plan Comments:         Anesthesia Quick Evaluation

## 2019-03-03 NOTE — Discharge Instructions (Signed)
Cholecystitis    Cholecystitis is inflammation of the gallbladder. It is often called a gallbladder attack. The gallbladder is a pear-shaped organ that lies beneath the liver on the right side of the body. The gallbladder stores bile, which is a fluid that helps the body digest fats. If bile builds up in your gallbladder, your gallbladder becomes inflamed.  This condition may occur suddenly. Cholecystitis is a serious condition and requires treatment.  What are the causes?  The most common cause of this condition is gallstones. Gallstones can block the tube (duct) that carries bile out of your gallbladder. This causes bile to build up.  Other causes include:  · Damage to the gallbladder due to a decrease in blood flow.  · Infections in the bile ducts.  · Scars or kinks in the bile ducts.  · Tumors in the liver, pancreas, or gallbladder.  What increases the risk?  You are more likely to develop this condition if:  · You have sickle cell disease.  · You take birth control pills or use estrogen.  · You have alcoholic liver disease.  · You have liver cirrhosis.  · You have your nutrition delivered through a vein (parenteral nutrition).  · You are critically ill.  · You do not eat or drink for a long time. This is also called "fasting."  · You are obese.  · You lose weight too fast.  · You are pregnant.  · You have high levels of fat (triglycerides) in the blood.  · You have pancreatitis.  What are the signs or symptoms?  Symptoms of this condition include:  · Pain in the abdomen, especially in the upper right area of the abdomen.  · Tenderness or bloating in the abdomen.  · Nausea.  · Vomiting.  · Fever.  · Chills.  How is this diagnosed?  This condition is diagnosed with a medical history and physical exam. You may also have other tests, including:  · Imaging tests, such as:  ? An ultrasound of the gallbladder.  ? A CT scan of the abdomen.  ? A gallbladder nuclear scan (HIDA scan). This scan allows your health care  provider to see the bile moving from your liver to your gallbladder and on to your small intestine.  ? MRI.  · Blood tests, such as:  ? A complete blood count. The white blood cell count may be higher than normal.  ? Liver function tests. Certain types of gallstones cause some results to be higher than normal.  How is this treated?  Treatment may include:  · Surgery to remove your gallbladder (cholecystectomy).  · Antibiotic medicine, usually through an IV.  · Fasting for a certain amount of time.  · Giving IV fluids.  · Medicine to treat pain or vomiting.  Follow these instructions at home:  · If you had surgery, follow instructions from your health care provider about home care after the procedure.  Medicines    · Take over-the-counter and prescription medicines only as told by your health care provider.  · If you were prescribed an antibiotic medicine, take it as told by your health care provider. Do not stop taking the antibiotic even if you start to feel better.  General instructions  · Follow instructions from your health care provider about what to eat or drink. When you are allowed to eat, avoid eating or drinking anything that triggers your symptoms.  · Do not lift anything that is heavier than 10 lb (4.5   kg), or the limit that you are told, until your health care provider says that it is safe.  · Do not use any products that contain nicotine or tobacco, such as cigarettes and e-cigarettes. If you need help quitting, ask your health care provider.  · Keep all follow-up visits as told by your health care provider. This is important.  Contact a health care provider if:  · Your pain is not controlled with medicine.  · You have a fever.  Get help right away if:  · Your pain moves to another part of your abdomen or to your back.  · You continue to have symptoms or you develop new symptoms even with treatment.  Summary  · Cholecystitis is inflammation of the gallbladder.  · The most common cause of this condition  is gallstones. Gallstones can block the tube (duct) that carries bile out of your gallbladder.  · Common symptoms are pain in the abdomen, nausea, vomiting, fever, and chills.  · This condition is treated with surgery to remove the gallbladder, medicines, fasting, and IV fluids.  · Follow your health care provider's instructions for eating and drinking. Avoid eating anything that triggers your symptoms.  This information is not intended to replace advice given to you by your health care provider. Make sure you discuss any questions you have with your health care provider.  Document Released: 10/23/2005 Document Revised: 03/01/2018 Document Reviewed: 03/01/2018  Elsevier Interactive Patient Education © 2019 [employer]

## 2019-03-03 NOTE — Progress Notes (Signed)
Chart reviewed.  MRCP negative choledocholithiasis; plan was for cholecystectomy, but patient has changed her mind.  LFTs are downtrending.  Nothing else to offer from GI perspective; please call with any questions; [facility] will sign-off.

## 2019-03-03 NOTE — Discharge Summary (Signed)
Physician Discharge Summary   Patient ID: Betty Carter MRN: [id number] DOB/AGE: [birth year] 33 y.o.  Admit date: 03/01/2019 Discharge date: 03/03/2019  Primary Care Physician:  Amada Jupiter, PA-C   Recommendations for Outpatient Follow-up:  1. Patient has declined to go through cholecystectomy 2. Patient strongly recommended to follow-up with [facility] to reconsider her decision for cholecystectomy 3. Low-fat diet  Home Health: None Equipment/Devices:   Discharge Condition: stable  CODE STATUS: FULL or DNR   Diet recommendation: Low-fat diet   Discharge Diagnoses:    Acute on chronic cholecystitis . Abdominal pain . Transaminitis . History of drug abuse in the past (HCC) . Anxiety and depression   Consults: Gastroenterology General surgery    Allergies:   Allergies  Allergen Reactions  . Morphine And Related Other (See Comments)    Recovering narcotic abuse     DISCHARGE MEDICATIONS: Allergies as of 03/03/2019      Reactions   Morphine And Related Other (See Comments)   Recovering narcotic abuse      Medication List    STOP taking these medications   furosemide 40 MG tablet Commonly known as:  LASIX     TAKE these medications   lansoprazole 30 MG capsule Commonly known as:  PREVACID Take 30 mg by mouth daily as needed (Heart Burn).   multivitamin with minerals Tabs tablet Take 1 tablet by mouth daily.   norethindrone 0.35 MG tablet Commonly known as:  MICRONOR Take 1 tablet by mouth daily.   ondansetron 4 MG disintegrating tablet Commonly known as:  Zofran ODT Take 1 tablet (4 mg total) by mouth every 8 (eight) hours as needed for nausea or vomiting.        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  No fever chills, no diarrhea.   Nausea but no vomiting. In ED patient was afebrile, normal renal function but elevated liver enzymes.  Ultrasound gallbladder showed a 9 mm dilated CBD, 1 stone in the gallbladder.   Hospital Course:  Acute on chronic cholecystitis, dilated CBD with elevated liver enzymes -Initially thought to be due to obstructive CBD.  GI was consulted. -Ultrasound showed 9 mm dilated CBD with 1 stone in gallbladder -Hepatitis panel negative.  She was placed on n.p.o. status with IV fluids. -Patient underwent MRCP which was negative for choledocholithiasis.  The gallstone noted on her ultrasound and CT scan was not seen on MRCP. -General surgery was consulted due to patient's history of severe abdominal pain with nausea and recommended laparoscopic cholecystectomy. -Patient however has been tolerating diet, ambulating without any difficulty, no nausea vomiting or abdominal pain currently.  She declined to go through the cholecystectomy at this time as her symptoms have improved. -Patient was seen by Dr. Michaell Cowing and recommended to call surgery office if patient reconsiders her decision for cholecystectomy    Transaminitis -Hepatitis panel negative, LFTs improving -MRCP was negative for choledocholithiasis and no gallstones seen  History of IV drug abuse in the past -Currently recovering and doing well, encouraged patient -Patient strongly requested no opiates during the hospitalization, did well requiring no pain medication   Day of Discharge S: No nausea vomiting or abdominal pain, doing well, declines to go through laparoscopic cholecystectomy today.  Wants to go home.  BP 119/81 (  BP Location: Right Arm)   Pulse 64   Temp 97.8 F (36.6 C) (Oral)   Resp 16 gPithVbM$  (1.626 m)   Wt 63.5 kg   LMP 01/22/2019   SpO2 99%   BMI 24.03 kg/m   Physical Exam: General: Alert and awake oriented x3 not in any acute distress. HEENT: anicteric sclera, pupils reactive to light and accommodation CVS:  S1-S2 clear no murmur rubs or gallops Chest: clear to auscultation bilaterally, no wheezing rales or rhonchi Abdomen: soft nontender, nondistended, normal bowel sounds Extremities: no cyanosis, clubbing or edema noted bilaterally Neuro: Cranial nerves II-XII intact, no focal neurological deficits   The results of significant diagnostics from this hospitalization (including imaging, microbiology, ancillary and laboratory) are listed below for reference.      Procedures/Studies:  Mr 3d Recon At Scanner  Result Date: 03/03/2019 CLINICAL DATA:  33 year old female with elevated liver function tests complaining of mid chest pain or and upper abdominal pain. Nausea and vomiting. IV drug abuse. EXAM: MRI ABDOMEN WITH CONTRAST (WITH MRCP) TECHNIQUE: Multiplanar multisequence MR imaging of the abdomen was performed following the administration of intravenous contrast. Heavily T2-weighted images of the biliary and pancreatic ducts were obtained, and three-dimensional MRCP images were rendered by post processing. CONTRAST:  7 mL of Gadavist. COMPARISON:  No prior abdominal MRI. Abdominal ultrasound 03/01/2019. FINDINGS: Lower chest: Unremarkable. Hepatobiliary: No suspicious cystic or solid hepatic lesions. No intra or extrahepatic biliary ductal dilatation noted on MRCP images. Common bile duct measures 4 mm in the porta hepatis. No definite filling defect in the common bile duct to suggest choledocholithiasis. Gallbladder is nearly completely decompressed, but otherwise unremarkable in appearance. Previously noted gallstone is not confidently identified on today's examination. Pancreas: No pancreatic mass. No pancreatic ductal dilatation noted on MRCP images. No pancreatic or peripancreatic fluid or inflammatory changes. Spleen:  Unremarkable. Adrenals/Urinary Tract: Bilateral kidneys and bilateral adrenal glands are normal in appearance. No hydroureteronephrosis is in the visualized portions of the abdomen.  Stomach/Bowel: Visualized portions are unremarkable. Vascular/Lymphatic: No aneurysm identified in the visualized abdominal vasculature. No lymphadenopathy noted in the abdomen. Other: No significant volume of ascites noted in the visualized portions of the peritoneal cavity. Musculoskeletal: No aggressive appearing osseous lesions are noted in the visualized portions of the skeleton. IMPRESSION: 1. No choledocholithiasis or evidence of biliary tract obstruction. 2. No acute findings in the abdomen. Electronically Signed   By: Trudie Reed M.D.   On: 03/02/2019 10:22   US Abdomen Limited  Result Date: 03/01/2019 CLINICAL DATA:  Right upper quadrant pain. EXAM: ULTRASOUND ABDOMEN LIMITED RIGHT UPPER QUADRANT COMPARISON:  None. FINDINGS: Gallbladder: There is a 5 mm stone in the gallbladder. There is also mild sludge. No wall thickening, pericholecystic fluid, or reported Murphy's sign. Common bile duct: Diameter: 9 mm Liver: No focal mass. Mild intrahepatic ductal dilatation. Portal vein is patent on color Doppler imaging with normal direction of blood flow towards the liver. IMPRESSION: 1. Intra and extrahepatic biliary ductal dilatation. The finding is concerning for biliary duct obstruction. Recommend correlation with labs. An MRCP or ERCP could better evaluate. 2. There is a 5 mm stone in the gallbladder in addition to a small amount of sludge without wall thickening, pericholecystic fluid, or Murphy's sign. Electronically Signed   By: Gerome Sam III M.D   On: 03/01/2019 05:06   Mr Abdomen With Mrcp W Contrast  Result Date: 03/02/2019 CLINICAL DATA:  33 year old female with elevated liver function tests complaining of mid  chest pain or and upper abdominal pain. Nausea and vomiting. IV drug abuse. EXAM: MRI ABDOMEN WITH CONTRAST (WITH MRCP) TECHNIQUE: Multiplanar multisequence MR imaging of the abdomen was performed following the administration of intravenous contrast. Heavily T2-weighted images  of the biliary and pancreatic ducts were obtained, and three-dimensional MRCP images were rendered by post processing. CONTRAST:  7 mL of Gadavist. COMPARISON:  No prior abdominal MRI. Abdominal ultrasound 03/01/2019. FINDINGS: Lower chest: Unremarkable. Hepatobiliary: No suspicious cystic or solid hepatic lesions. No intra or extrahepatic biliary ductal dilatation noted on MRCP images. Common bile duct measures 4 mm in the porta hepatis. No definite filling defect in the common bile duct to suggest choledocholithiasis. Gallbladder is nearly completely decompressed, but otherwise unremarkable in appearance. Previously noted gallstone is not confidently identified on today's examination. Pancreas: No pancreatic mass. No pancreatic ductal dilatation noted on MRCP images. No pancreatic or peripancreatic fluid or inflammatory changes. Spleen:  Unremarkable. Adrenals/Urinary Tract: Bilateral kidneys and bilateral adrenal glands are normal in appearance. No hydroureteronephrosis is in the visualized portions of the abdomen. Stomach/Bowel: Visualized portions are unremarkable. Vascular/Lymphatic: No aneurysm identified in the visualized abdominal vasculature. No lymphadenopathy noted in the abdomen. Other: No significant volume of ascites noted in the visualized portions of the peritoneal cavity. Musculoskeletal: No aggressive appearing osseous lesions are noted in the visualized portions of the skeleton. IMPRESSION: 1. No choledocholithiasis or evidence of biliary tract obstruction. 2. No acute findings in the abdomen. Electronically Signed   By: Trudie Reed M.D.   On: 03/02/2019 10:22       LAB RESULTS: Basic Metabolic Panel: Recent Labs  Lab 03/02/19 0330 03/03/19 0401  NA 139 139  K 3.8 4.1  CL 108 109  CO2 23 22  GLUCOSE 93 92  BUN 14 12  CREATININE 0.68 0.72  CALCIUM 8.7* 8.7*   Liver Function Tests: Recent Labs  Lab 03/02/19 0330 03/03/19 0401  AST 210* 95*  ALT 426* 295*  ALKPHOS  124 120  BILITOT 4.2* 2.6*  PROT 5.9* 6.1*  ALBUMIN 3.3* 3.4*   Recent Labs  Lab 03/01/19 0458  LIPASE 59*   No results for input(s): AMMONIA in the last 168 hours. CBC: Recent Labs  Lab 03/01/19 0423 03/02/19 0330 03/03/19 0401  WBC 9.4 6.6 6.9  NEUTROABS 6.5  --   --   HGB 13.7 11.6* 11.7*  HCT 42.4 36.3 37.9  MCV 88.9 90.1 91.3  PLT 348 268 279   Cardiac Enzymes: No results for input(s): CKTOTAL, CKMB, CKMBINDEX, TROPONINI in the last 168 hours. BNP: Invalid input(s): POCBNP CBG: No results for input(s): GLUCAP in the last 168 hours.    Disposition and Follow-up: Discharge Instructions    Diet - low sodium heart healthy   Complete by:  As directed    Discharge instructions   Complete by:  As directed    LOW FAT DIET. Please call [facility] office, if symptoms return   Increase activity slowly   Complete by:  As directed        DISPOSITION:.  Currently stable for discharge however patient recommended to follow-up with surgery and return back to ED if her symptoms recur   DISCHARGE FOLLOW-UP Follow-up Information    [facility], [place]. Schedule an appointment as soon as possible for a visit in 3 weeks.   Specialty:  General Surgery Why:  To reconsider surgery to remove your gallbladder & prevent imminent future attacks Contact information: [address]  [place] [id number] [phone]           Time coordinating discharge:  35 minutes  Signed:   Thad Rangeripudeep Rai M.D. [facility] 03/03/2019, 11:32 AM

## 2019-03-03 NOTE — Progress Notes (Addendum)
Paged by floor nurse that the patient has changed her mind again.  Does not want to have surgery.  Will think about considering it in the future.  I asked the nurse to let the primary service know.  We will sign off for now since she chooses to go AGAINST MEDICAL ADVICE and this is not a emergency today, even though this would be an ideal window to get it done to avoid future attacks.  Reconsult as needed.  Reached patient's mother, Nettie Elm, as well Ardeth Sportsman, MD, FACS, MASCRS Gastrointestinal and Minimally Invasive Surgery    [address], [address] [place], [place] [address] [phone] [address] / Paging [phone] Fax

## 2020-11-03 ENCOUNTER — Other Ambulatory Visit: Payer: Self-pay | Admitting: Gynecology

## 2020-11-03 DIAGNOSIS — R947 Abnormal results of other endocrine function studies: Secondary | ICD-10-CM

## 2020-11-19 ENCOUNTER — Ambulatory Visit
Admission: RE | Admit: 2020-11-19 | Discharge: 2020-11-19 | Disposition: A | Discharge status: Home / Self Care | Payer: Medicaid Other | Source: Ambulatory Visit | Attending: Gynecology | Admitting: Gynecology

## 2020-11-19 ENCOUNTER — Other Ambulatory Visit: Payer: Self-pay

## 2020-11-19 DIAGNOSIS — R947 Abnormal results of other endocrine function studies: Secondary | ICD-10-CM

## 2020-11-19 MED ORDER — GADOBENATE DIMEGLUMINE 529 MG/ML IV SOLN
8.0000 mL | Freq: Once | INTRAVENOUS | Status: AC | PRN
  Administered 2020-11-19: 8 mL via INTRAVENOUS

## 2021-12-16 ENCOUNTER — Ambulatory Visit (INDEPENDENT_AMBULATORY_CARE_PROVIDER_SITE_OTHER): Payer: 59

## 2021-12-16 ENCOUNTER — Other Ambulatory Visit: Payer: Self-pay

## 2021-12-16 ENCOUNTER — Ambulatory Visit: Payer: 59 | Admitting: Podiatrist

## 2021-12-16 DIAGNOSIS — M21611 Bunion of right foot: Secondary | ICD-10-CM | POA: Diagnosis not present

## 2021-12-16 DIAGNOSIS — M21619 Bunion of unspecified foot: Secondary | ICD-10-CM

## 2021-12-16 DIAGNOSIS — M21612 Bunion of left foot: Secondary | ICD-10-CM | POA: Diagnosis not present

## 2021-12-16 DIAGNOSIS — M7751 Other enthesopathy of right foot: Secondary | ICD-10-CM | POA: Diagnosis not present

## 2021-12-16 NOTE — Progress Notes (Signed)
Chief Complaint  Patient presents with   Bunions    Right bunion x 1+ years. Right over left. Painful with redness and edema. Occasional numbness and tingling with prolong sitting and crossed legs. Difficult with certain shoes.      HPI: Patient is 36 y.o. female who presents today for a painful bunion right greater than left.  She relates the bunion is painful in certain shoes and with pressure over the area.  She relates swelling and tingling of the bunion prominence with prolonged sitting and crossed legs. She has tried no treatment other than shoe gear changes.    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     HAILEY 24 FE 1-20 MG-MCG(24) tablet Take 1 tablet by mouth daily.     meloxicam (MOBIC) 15 MG tablet Take 15 mg by mouth daily.     Multiple Vitamin (MULTIVITAMIN WITH MINERALS) TABS tablet Take 1 tablet by mouth daily.     No current facility-administered medications on file prior to visit.    Allergies  Allergen Reactions   Morphine And Related Other (See Comments)    Recovering narcotic abuse    Review of Systems No fevers, chills, nausea, muscle aches, no difficulty breathing, no calf pain, no chest pain or shortness of breath.   Physical Exam  GENERAL APPEARANCE: Alert, conversant. Appropriately groomed. No acute distress.   VASCULAR: Pedal pulses palpable DP and PT bilateral.  Capillary refill time is immediate to all digits,  Proximal to distal cooling it warm to warm.  Digital perfusion adequate.   NEUROLOGIC: sensation  is intact to 5.07 monofilament at 5/5 sites bilateral.  Light touch is intact bilateral, vibratory sensation intact bilateral  MUSCULOSKELETAL: notable bunion deformity is noted right greater than left.  Flexible first mpj and first ray is noted bilateral.  Pain with direct pressure is noted right greater than left.  Swelling around the medial prominence is note right compared to left.     DERMATOLOGIC: skin is warm, supple, and dry.  No open lesions noted.  No rash, no pre ulcerative lesions. Digital nails are asymptomatic.    Xrays- 2 views of the left and right foot are obtained.  Increased first intermetatarsal angle is noted bilaterally, mild lateral deviation of the sesamoid complex and lateral deviation of the hallux is noted right greater than left. Increased soft tissue swelling over the medial prominence of the the first metatarsal head is noted right greater than left.     Assessment     ICD-10-CM   1. Bunion  M21.619 DG Foot 2 Views Right    DG Foot 2 Views Left    2. Bursitis of right foot  M77.51        Plan  Discussed exam and xray findings. Discussed the etiology and pathology of a bunion deformity. Discussed her soft tissue does appear swollen over the medial bunion prominence of the right likely contributing to her pain.  Discussed we could try a steroid injection to try and decrease the inflammation. The patient agreed and 4mg  dexamethasone and  marcaine plain was infiltrated into the bursae of the right first metatarsal head medially. The patient tolerated this well.  Discussed she would ultimately need to think about a bunion correction if the injection is of no benefit and she has pain that is recurring.  If she is interested in discussing further, she will follow up with one of our surgeons at her convenience.

## 2021-12-16 NOTE — Patient Instructions (Signed)

## 2021-12-20 ENCOUNTER — Encounter: Payer: Self-pay | Admitting: Podiatrist

## 2022-10-25 IMAGING — MR MR HEAD WO/W CM
15 of 19 series · 34 of 48 positions shown · IV contrast (8ml Multihance)
Comparison: None.

CLINICAL DATA: Abnormal result of other endocrine function studies.

EXAM:
MRI HEAD WITHOUT AND WITH CONTRAST
TECHNIQUE: Multiplanar, multiecho pulse sequences of the brain and surrounding
structures were obtained without and with intravenous contrast.
CONTRAST:  8mL MULTIHANCE GADOBENATE DIMEGLUMINE 529 MG/ML IV SOLN

[Series 2: T1 · sagittal · 5.0mm · 0.45mm/px · 3 of 19 slices shown]
[im 1/19]
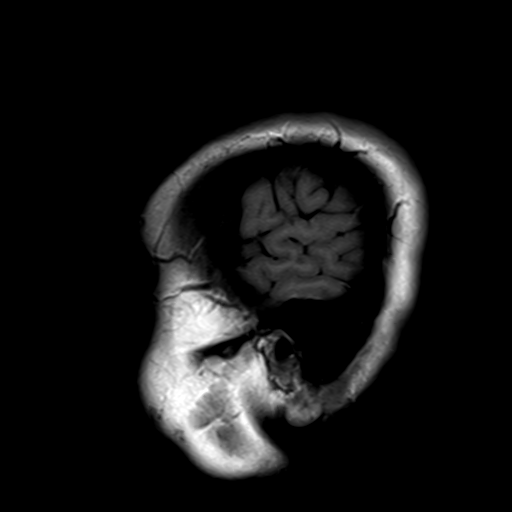
[im 10/19]
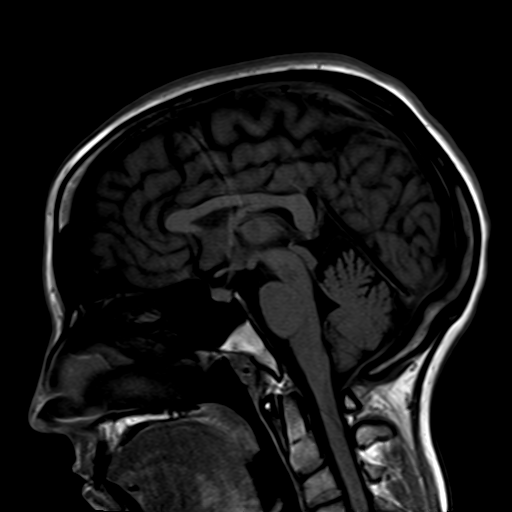
[im 19/19]
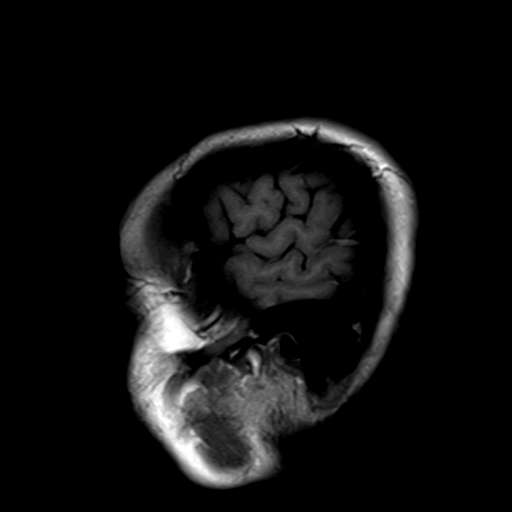

[Series 3: DWI · axial · 3.0mm · 1.80mm/px · z∈[-56,+91]mm · 8 of 100 slices shown]
[im 1/100]
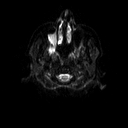
[im 12/100]
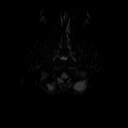
[im 34/100]
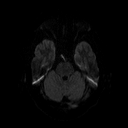
[im 45/100]
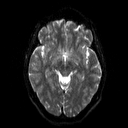
[im 56/100]
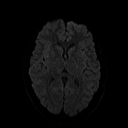
[im 67/100]
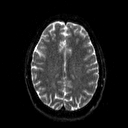
[im 89/100]
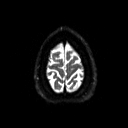
[im 100/100]
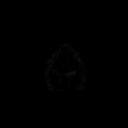

[Series 4: dwi_adc · axial · 3.0mm · 1.80mm/px · z∈[-56,+91]mm · 5 of 50 slices shown]
[im 1/50]
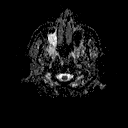
[im 13/50]
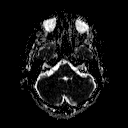
[im 25/50]
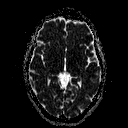
[im 37/50]
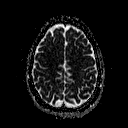
[im 50/50]
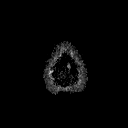

[Series 5: T2 · axial · 5.0mm · 0.36mm/px · z∈[-44,+92]mm · 2 of 22 slices shown]
[im 1/22]
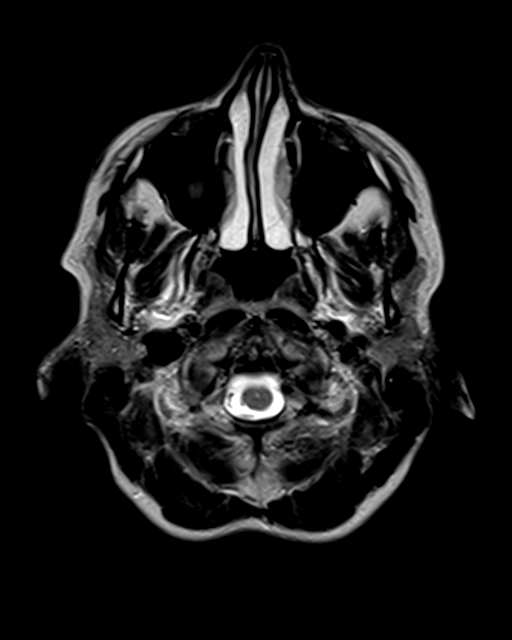
[im 22/22]
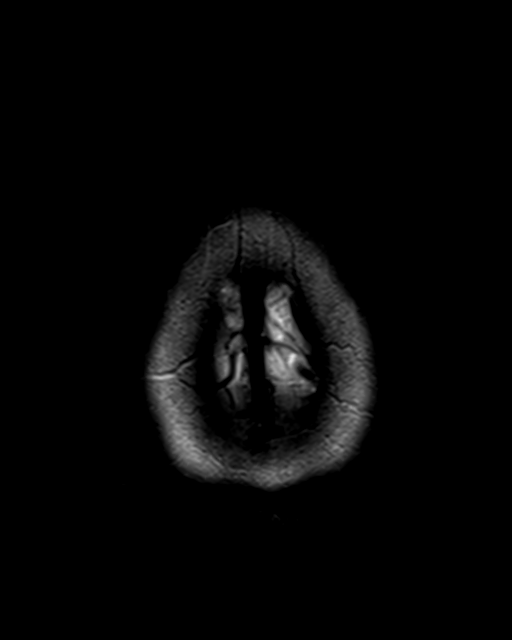

[Series 8: swi_images · axial · 4.0mm · 0.94mm/px · z∈[-46,+94]mm · 4 of 36 slices shown]
[im 1/36]
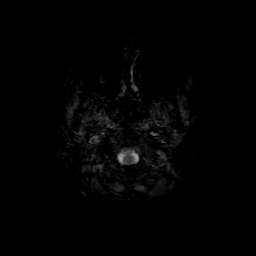
[im 12/36]
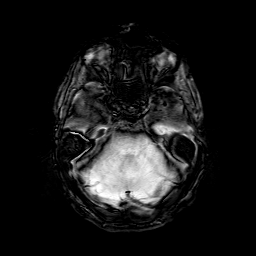
[im 24/36]
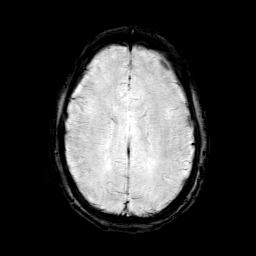
[im 36/36]
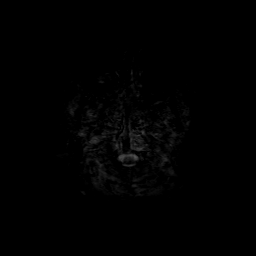

[Series 9: FLAIR · axial · 3.0mm · 0.45mm/px · z∈[-43,+92]mm · 3 of 30 slices shown]
[im 1/30]
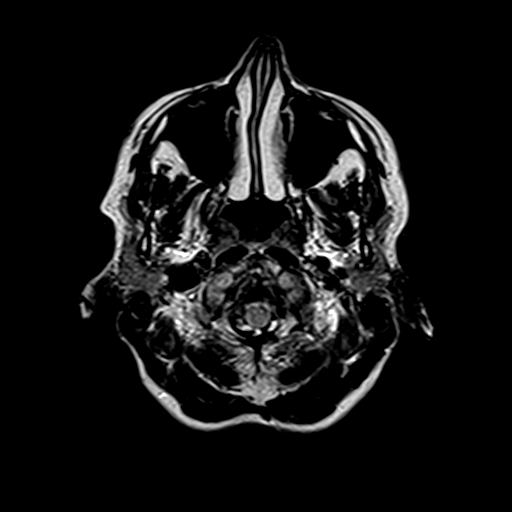
[im 15/30]
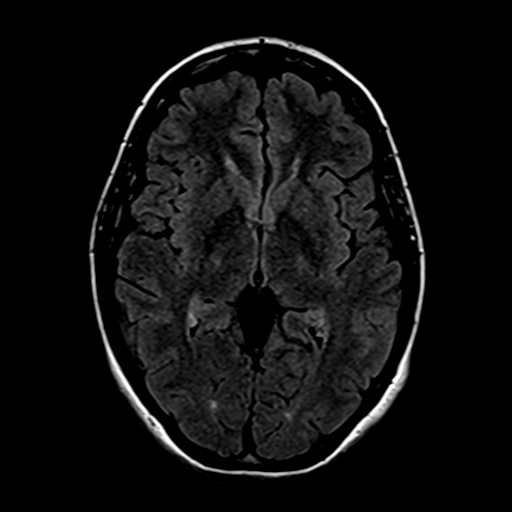
[im 30/30]
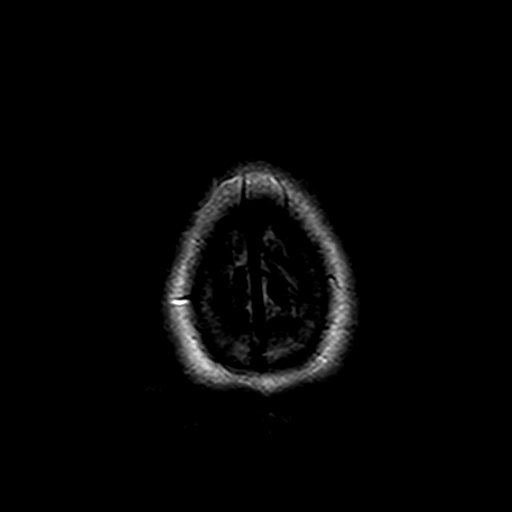

[Series 10: sag 3mm · sagittal · 3.0mm · 0.33mm/px · 1 of 11 slices shown]
[im 1/11]
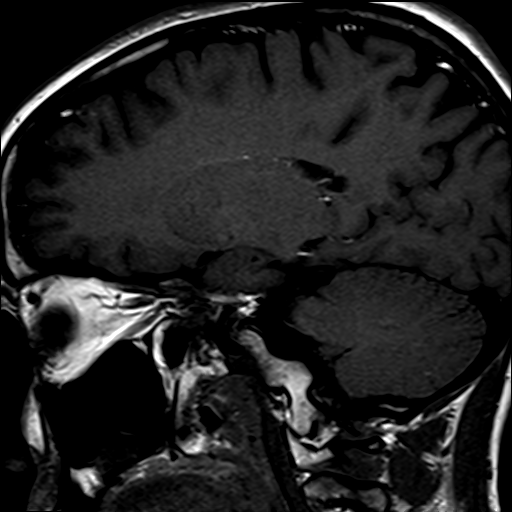

[Series 11: cor 3mm · coronal · 3.0mm · 0.33mm/px · 1 of 11 slices shown]
[im 1/11]
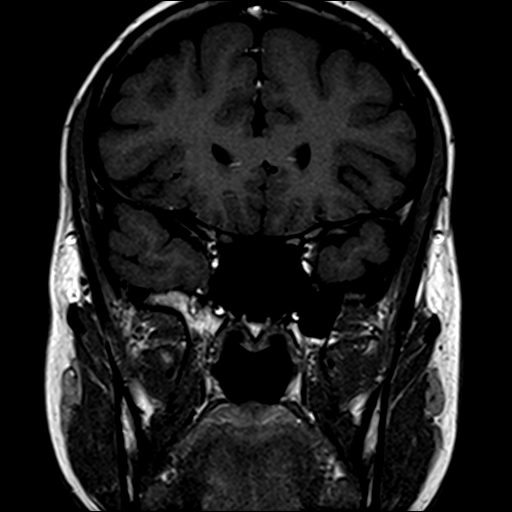

[Series 12: pre cor dynamic · coronal · non-contrast · 3.0mm · 0.35mm/px · 1 of 10 slices shown]
[im 1/10]
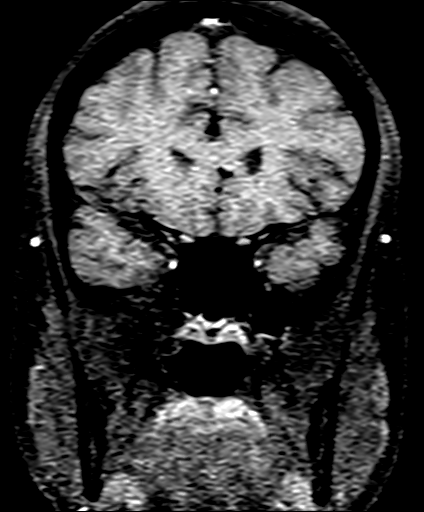

[Series 13: post fs cor · coronal · 3.0mm · 0.35mm/px · 1 of 10 slices shown (1 of 6)]
[im 1/10]
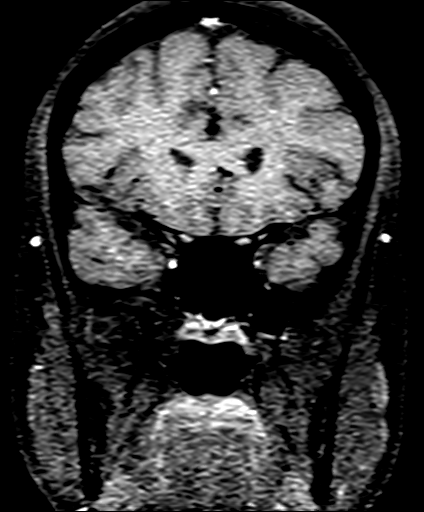

[Series 14: post fs cor · coronal · 3.0mm · 0.35mm/px · 1 of 10 slices shown (2 of 6)]
[im 1/10]
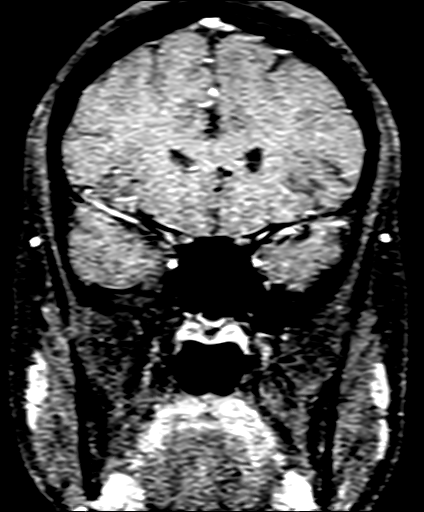

[Series 15: post fs cor · coronal · 3.0mm · 0.35mm/px · 1 of 10 slices shown (3 of 6)]
[im 1/10]
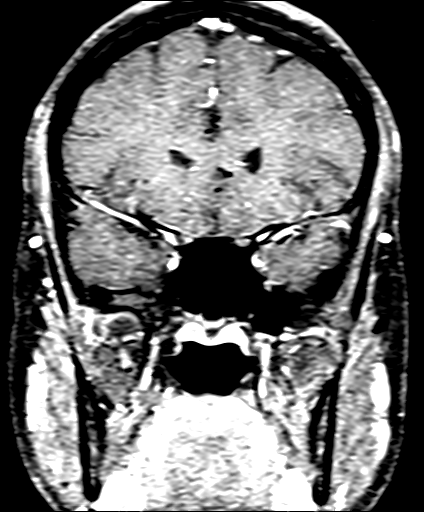

[Series 16: post fs cor · coronal · 3.0mm · 0.35mm/px · 1 of 10 slices shown (4 of 6)]
[im 1/10]
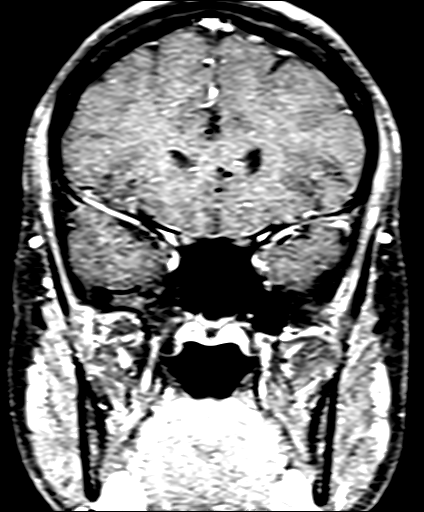

[Series 17: post fs cor · coronal · 3.0mm · 0.35mm/px · 1 of 10 slices shown (5 of 6)]
[im 1/10]
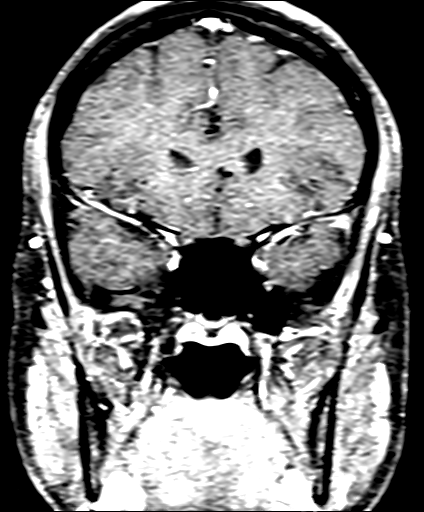

[Series 18: post fs cor · coronal · 3.0mm · 0.35mm/px · 1 of 10 slices shown (6 of 6)]
[im 1/10]
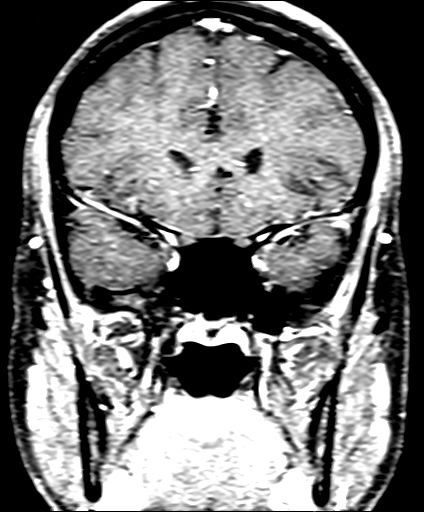

[34 of 48 positions shown; findings below may reference images not displayed]

FINDINGS: Brain: No acute infarction, hemorrhage, hydrocephalus, extra-axial
collection or mass lesion.

A few scattered foci of T2 hyperintensity within the white matter of
the bilateral frontal lobes and within the pons, nonspecific.

Pituitary/Sella: The pituitary gland is normal in appearance without
mass lesion. A normal posterior pituitary bright spot is seen. The
infundibulum is midline. The hypothalamus and mamillary bodies are
normal. There is no mass effect on the optic chiasm or optic nerves.
The infundibular and chiasmatic recesses are clear. Normal cavernous
sinus and cavernous internal carotid artery flow voids.

Vascular: Normal flow voids.

Skull and upper cervical spine: Normal marrow signal.

Sinuses/Orbits: Mucosal thickening of the bilateral maxillary
sinuses. The orbits are maintained.
IMPRESSION: 1. Normal MRI of the pituitary gland.
2. A few scattered foci of T2 hyperintensity within the white matter
of the bilateral frontal lobes and within the pons, nonspecific.
Differential considerations sequelae of prior
infectious/inflammatory process, migraine headache, or demyelinating
disease.

## 2024-11-28 ENCOUNTER — Other Ambulatory Visit: Payer: Self-pay | Admitting: General Surgery

## 2024-11-28 DIAGNOSIS — M7989 Other specified soft tissue disorders: Secondary | ICD-10-CM

## 2024-12-05 ENCOUNTER — Ambulatory Visit
Admission: RE | Admit: 2024-12-05 | Discharge: 2024-12-05 | Disposition: A | Payer: PRIVATE HEALTH INSURANCE | Source: Ambulatory Visit | Attending: General Surgery | Admitting: General Surgery

## 2024-12-05 DIAGNOSIS — M7989 Other specified soft tissue disorders: Secondary | ICD-10-CM
